# Patient Record
Sex: Male | Born: 1975 | Race: White | Hispanic: No | Marital: Married | Smoking: Current every day smoker
Health system: Southern US, Community
[De-identification: ages and names within clinical notes are randomized; demographics above are authoritative.]

## PROBLEM LIST (undated history)

## (undated) DIAGNOSIS — R9082 White matter disease, unspecified: Secondary | ICD-10-CM

## (undated) DIAGNOSIS — G629 Polyneuropathy, unspecified: Secondary | ICD-10-CM

## (undated) DIAGNOSIS — M1990 Unspecified osteoarthritis, unspecified site: Secondary | ICD-10-CM

## (undated) DIAGNOSIS — R413 Other amnesia: Secondary | ICD-10-CM

## (undated) DIAGNOSIS — J302 Other seasonal allergic rhinitis: Secondary | ICD-10-CM

## (undated) DIAGNOSIS — T7840XA Allergy, unspecified, initial encounter: Secondary | ICD-10-CM

## (undated) DIAGNOSIS — Z8719 Personal history of other diseases of the digestive system: Secondary | ICD-10-CM

## (undated) DIAGNOSIS — G43909 Migraine, unspecified, not intractable, without status migrainosus: Secondary | ICD-10-CM

## (undated) DIAGNOSIS — K5790 Diverticulosis of intestine, part unspecified, without perforation or abscess without bleeding: Secondary | ICD-10-CM

## (undated) DIAGNOSIS — K635 Polyp of colon: Secondary | ICD-10-CM

## (undated) DIAGNOSIS — G4733 Obstructive sleep apnea (adult) (pediatric): Secondary | ICD-10-CM

## (undated) DIAGNOSIS — E785 Hyperlipidemia, unspecified: Secondary | ICD-10-CM

## (undated) DIAGNOSIS — S6291XA Unspecified fracture of right wrist and hand, initial encounter for closed fracture: Secondary | ICD-10-CM

## (undated) DIAGNOSIS — H53453 Other localized visual field defect, bilateral: Secondary | ICD-10-CM

## (undated) DIAGNOSIS — I10 Essential (primary) hypertension: Secondary | ICD-10-CM

## (undated) DIAGNOSIS — K4090 Unilateral inguinal hernia, without obstruction or gangrene, not specified as recurrent: Secondary | ICD-10-CM

## (undated) DIAGNOSIS — K219 Gastro-esophageal reflux disease without esophagitis: Secondary | ICD-10-CM

## (undated) DIAGNOSIS — G4730 Sleep apnea, unspecified: Secondary | ICD-10-CM

## (undated) DIAGNOSIS — S82201A Unspecified fracture of shaft of right tibia, initial encounter for closed fracture: Secondary | ICD-10-CM

## (undated) DIAGNOSIS — G459 Transient cerebral ischemic attack, unspecified: Secondary | ICD-10-CM

## (undated) DIAGNOSIS — Z862 Personal history of diseases of the blood and blood-forming organs and certain disorders involving the immune mechanism: Secondary | ICD-10-CM

## (undated) DIAGNOSIS — G5603 Carpal tunnel syndrome, bilateral upper limbs: Secondary | ICD-10-CM

## (undated) DIAGNOSIS — R748 Abnormal levels of other serum enzymes: Secondary | ICD-10-CM

## (undated) DIAGNOSIS — Z87442 Personal history of urinary calculi: Secondary | ICD-10-CM

## (undated) HISTORY — DX: Obstructive sleep apnea (adult) (pediatric): G47.33

## (undated) HISTORY — DX: Unilateral inguinal hernia, without obstruction or gangrene, not specified as recurrent: K40.90

## (undated) HISTORY — PX: TIBIA FRACTURE SURGERY: SHX806

## (undated) HISTORY — DX: Sleep apnea, unspecified: G47.30

## (undated) HISTORY — DX: Essential (primary) hypertension: I10

## (undated) HISTORY — DX: Polyp of colon: K63.5

## (undated) HISTORY — PX: WISDOM TOOTH EXTRACTION: SHX21

## (undated) HISTORY — DX: Hyperlipidemia, unspecified: E78.5

## (undated) HISTORY — DX: Gastro-esophageal reflux disease without esophagitis: K21.9

## (undated) HISTORY — DX: Allergy, unspecified, initial encounter: T78.40XA

## (undated) HISTORY — DX: Unspecified osteoarthritis, unspecified site: M19.90

---
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
## 2018-04-14 ENCOUNTER — Encounter (INDEPENDENT_AMBULATORY_CARE_PROVIDER_SITE_OTHER): Payer: Self-pay

## 2018-04-14 ENCOUNTER — Encounter: Payer: Self-pay | Admitting: Gastroenterology

## 2018-04-14 ENCOUNTER — Other Ambulatory Visit (INDEPENDENT_AMBULATORY_CARE_PROVIDER_SITE_OTHER): Payer: BLUE CROSS/BLUE SHIELD

## 2018-04-14 ENCOUNTER — Ambulatory Visit (INDEPENDENT_AMBULATORY_CARE_PROVIDER_SITE_OTHER): Payer: BLUE CROSS/BLUE SHIELD | Admitting: Gastroenterology

## 2018-04-14 VITALS — BP 120/72 | HR 68 | Ht 75.0 in | Wt 215.0 lb

## 2018-04-14 DIAGNOSIS — R1310 Dysphagia, unspecified: Secondary | ICD-10-CM | POA: Diagnosis not present

## 2018-04-14 DIAGNOSIS — R12 Heartburn: Secondary | ICD-10-CM | POA: Diagnosis not present

## 2018-04-14 DIAGNOSIS — K219 Gastro-esophageal reflux disease without esophagitis: Secondary | ICD-10-CM

## 2018-04-14 LAB — CBC
HCT: 44 % (ref 39.0–52.0)
Hemoglobin: 15.1 g/dL (ref 13.0–17.0)
MCHC: 34.4 g/dL (ref 30.0–36.0)
MCV: 90 fl (ref 78.0–100.0)
Platelets: 346 10*3/uL (ref 150.0–400.0)
RBC: 4.89 Mil/uL (ref 4.22–5.81)
RDW: 13.3 % (ref 11.5–15.5)
WBC: 11.1 10*3/uL — ABNORMAL HIGH (ref 4.0–10.5)

## 2018-04-14 LAB — SEDIMENTATION RATE: Sed Rate: 15 mm/hr (ref 0–15)

## 2018-04-14 LAB — BASIC METABOLIC PANEL
BUN: 26 mg/dL — ABNORMAL HIGH (ref 6–23)
CO2: 24 mEq/L (ref 19–32)
Calcium: 10.3 mg/dL (ref 8.4–10.5)
Chloride: 102 mEq/L (ref 96–112)
Creatinine, Ser: 0.95 mg/dL (ref 0.40–1.50)
GFR: 87.09 mL/min (ref >60.00)
Glucose, Bld: 89 mg/dL (ref 70–99)
POTASSIUM: 4 meq/L (ref 3.5–5.1)
Sodium: 138 mEq/L (ref 135–145)

## 2018-04-14 MED ORDER — OMEPRAZOLE 40 MG PO CPDR: 40.0000 mg | DELAYED_RELEASE_CAPSULE | Freq: Two times a day (BID) | ORAL | 2 refills | Status: DC

## 2018-04-14 NOTE — Progress Notes (Signed)
[facility] VISIT   Primary Care Provider Pomposini, Cherly Anderson, MD No address on file [phone]  Referring Provider PCP as above  Patient Profile: Colton Walker is a 43 y.o. male with a pmh significant for hx of hypertension, hyperlipidemia, sleep apnea (on CPAP), seasonal allergies, osteoarthritis, prior TIA (on aspirin), longstanding GERD.  The patient presents to the [facility] for an evaluation and management of problem(s) noted below:  Problem List 1. Gastroesophageal reflux disease, esophagitis presence not specified   2. Dysphagia, unspecified type   3. Pyrosis     History of Present Illness: This is the patient's first visit to the outpatient [place] clinic.  The patient states that since he was a teenager who dealt with issues of acid reflux.  Starting around the age of 41 or 5 he began to see a gastroenterologist and began taking PPI therapy.  He is never been on high-dose PPI therapy twice daily but continue taking a PPI until his early 50s.  He describes his symptoms as being heartburn and a burning sensation in his mid chest.  Around the age of 65-14 he began undergoing periodic endoscopies and recalls having multiple endoscopies every other month to "stretch his esophagus".  He recalls these being helpful but has not had any repeat endoscopy since his teenage years.  Approximately 10 years ago he stopped taking his medications because there was no difference in his symptoms.  Approximately 2 months ago he began to experience issues of worsening heartburn last solid food dysphagia.  Within the last month he was diagnosed with strep throat and treated for that.  Pills have been a problem however he does have to regurgitate his food on a frequent basis, at least every other day if not every day.  Liquids passed without issue.  His weights have been stable.  Because of these issues he began to restart omeprazole and has been  taking it at 20 mg twice a day (but only at one point during the day for a total of 40 mg daily).  The patient does not take any other nonsteroidals or BC 300 he never had a colonoscopy but has had an endoscopy as described previously.  GI Review of Systems Positive as above Negative for odynophagia, globus, epigastric pain, nausea, hematemesis, jaundice, change in bowel habits, melena, hematochezia  Review of Systems General: Denies fevers/chills HEENT: Denies oral lesions Cardiovascular: Denies chest pain/palpitations Pulmonary: Denies shortness of breath/nocturnal cough Gastroenterological: See HPI Genitourinary: Denies darkened urine Hematological: Denies easy bruising/bleeding Endocrine: Denies temperature intolerance Dermatological: Denies jaundice Psychological: Mood is stable but hopeful that we can make a difference for him   Medications Current Outpatient Medications  Medication Sig Dispense Refill  . aspirin EC 81 MG tablet Take 81 mg by mouth daily.    . Cyanocobalamin (B-12) 1000 MCG TABS Take 1 tablet by mouth daily.    Marland Kitchen lisinopril (PRINIVIL,ZESTRIL) 10 MG tablet Take 10 mg by mouth daily.    . multivitamin (ONE-A-DAY MEN'S) TABS tablet Take 1 tablet by mouth daily.    . propranolol (INDERAL) 20 MG tablet Take 20 mg by mouth 3 (three) times daily.    . rosuvastatin (CRESTOR) 10 MG tablet Take 10 mg by mouth daily.    Marland Kitchen omeprazole (PRILOSEC) 40 MG capsule Take 1 capsule (40 mg total) by mouth 2 (two) times daily. 60 capsule 2   No current facility-administered medications for this visit.     Allergies Allergies  Allergen Reactions  .  Lortab [Hydrocodone-Acetaminophen]   . Morphine Nausea Only  . Penicillins     Histories Past Medical History:  Diagnosis Date  . Allergy   . Arthritis   . GERD (gastroesophageal reflux disease)   . HTN (hypertension)   . Hyperlipidemia   . OSA (obstructive sleep apnea)    uses cpap  . Sleep apnea    Past Surgical  History:  Procedure Laterality Date  . TIBIA FRACTURE SURGERY Right    Social History   Socioeconomic History  . Marital status: Married    Spouse name: Not on file  . Number of children: Not on file  . Years of education: Not on file  . Highest education level: Not on file  Occupational History  . Not on file  Social Needs  . Financial resource strain: Not on file  . Food insecurity:    Worry: Not on file    Inability: Not on file  . Transportation needs:    Medical: Not on file    Non-medical: Not on file  Tobacco Use  . Smoking status: Current Every Day Smoker    Packs/day: 0.50    Types: Cigarettes  . Smokeless tobacco: Never Used  Substance and Sexual Activity  . Alcohol use: Never    Frequency: Never  . Drug use: Never  . Sexual activity: Not on file  Lifestyle  . Physical activity:    Days per week: Not on file    Minutes per session: Not on file  . Stress: Not on file  Relationships  . Social connections:    Talks on phone: Not on file    Gets together: Not on file    Attends religious service: Not on file    Active member of club or organization: Not on file    Attends meetings of clubs or organizations: Not on file    Relationship status: Not on file  . Intimate partner violence:    Fear of current or ex partner: Not on file    Emotionally abused: Not on file    Physically abused: Not on file    Forced sexual activity: Not on file  Other Topics Concern  . Not on file  Social History Narrative  . Not on file   Family History  Problem Relation Age of Onset  . Hypertension Mother   . Heart disease Father   . Hypertension Father   . Colon cancer Neg Hx   . Esophageal cancer Neg Hx   . Stomach cancer Neg Hx   . Rectal cancer Neg Hx   . Inflammatory bowel disease Neg Hx   . Liver disease Neg Hx   . Pancreatic cancer Neg Hx    I have reviewed his medical, social, and family history in detail and updated the electronic medical record as necessary.     PHYSICAL EXAMINATION  BP 120/72   Pulse 68   Ht 6\' 3"  (1.905 m)   Wt 215 lb (97.5 kg)   BMI 26.87 kg/m  Wt Readings from Last 3 Encounters:  04/16/18 215 lb (97.5 kg)  04/14/18 215 lb (97.5 kg)  GEN: NAD, appears stated age, doesn't appear chronically ill PSYCH: Cooperative, without pressured speech EYE: Conjunctivae pink, sclerae anicteric ENT: MMM, without oral ulcers, no erythema or exudates noted NECK: Supple CV: RR without R/Gs  RESP: CTAB posteriorly, without wheezing GI: NABS, soft, NT/ND, without rebound or guarding, no HSM appreciated MSK/EXT: No lower extremity edema SKIN: No jaundice NEURO:  Alert &  Oriented x 3, no focal deficits   REVIEW OF DATA  I reviewed the following data at the time of this encounter:  GI Procedures and Studies  No relevant studies to review His previous pediatric gastroenterologist has retired and he cannot obtain records  Laboratory Studies  Reviewed outside records  Imaging Studies  No relevant studies to review   ASSESSMENT  Mr. Russey is a 43 y.o. male with a pmh significant for hx of hypertension, hyperlipidemia, sleep apnea (on CPAP), seasonal allergies, osteoarthritis, prior TIA (on aspirin), longstanding GERD.  The patient is seen today for evaluation and management of:  1. Gastroesophageal reflux disease, esophagitis presence not specified   2. Dysphagia, unspecified type   3. Pyrosis    The patient is hemodynamically stable presents for evaluation as above.  Longstanding issues of GERD-like symptoms with now progressive issues of dysphagia with solid food regurgitation.  Does not sound to be rumination syndrome.  As young as the patient is and as young as he did have symptoms I do think we need to rule out eosinophilic esophagitis and lymphocytic esophagitis as potential causes of his persistent symptoms.  We will maximize his PPI therapy and over the course of the coming weeks until we can get him in for an endoscopy plan  on keeping him on that.  If his work-up above is unremarkable for source of dysphagia and I think it would be reasonable to consider a manometry and potential pH impedance testing.  I will see how he does with high-dose PPI therapy and what his endoscopy shows.  We will plan possible dilation if we find a stricture or ring.  The risks and benefits of endoscopic evaluation were discussed with the patient; these include but are not limited to the risk of perforation, infection, bleeding, missed lesions, lack of diagnosis, severe illness requiring hospitalization, as well as anesthesia and sedation related illnesses.  The patient is agreeable to proceed.  All patient questions were answered, to the best of my ability, and the patient agrees to the aforementioned plan of action with follow-up as indicated.   PLAN  Laboratories as outlined below Omeprazole 40 mg twice daily to be prescribed Proceed with diagnostic endoscopy (esophageal and gastric biopsies) Follow-up thereafter to consider role of manometry/pH impedance   Orders Placed This Encounter  Procedures  . CBC  . Basic Metabolic Panel (BMET)  . Sedimentation rate  . Ambulatory referral to Gastroenterology    New Prescriptions   OMEPRAZOLE (PRILOSEC) 40 MG CAPSULE    Take 1 capsule (40 mg total) by mouth 2 (two) times daily.   Modified Medications   No medications on file    Planned Follow Up: No follow-ups on file.   Justice Britain, MD [facility] Office # [phone]

## 2018-04-14 NOTE — Patient Instructions (Addendum)
If you are age 42 or older, your body mass index should be between 23-30. Your Body mass index is 26.87 kg/m. If this is out of the aforementioned range listed, please consider follow up with your Primary Care Provider.  If you are age 46 or younger, your body mass index should be between 19-25. Your Body mass index is 26.87 kg/m. If this is out of the aformentioned range listed, please consider follow up with your Primary Care Provider.   Your provider has requested that you go to the basement level for lab work before leaving today. Press "B" on the elevator. The lab is located at the first door on the left as you exit the elevator.   We have sent the following medications to your pharmacy for you to pick up at your convenience: Omeprazole   You have been scheduled for an endoscopy. Please follow written instructions given to you at your visit today. If you use inhalers (even only as needed), please bring them with you on the day of your procedure. Your physician has requested that you go to www.startemmi.com and enter the access code given to you at your visit today. This web site gives a general overview about your procedure. However, you should still follow specific instructions given to you by our office regarding your preparation for the procedure.  Thank you for choosing me and [facility].  Dr. Rush Landmark

## 2018-04-16 ENCOUNTER — Ambulatory Visit (AMBULATORY_SURGERY_CENTER): Payer: BLUE CROSS/BLUE SHIELD | Admitting: Gastroenterology

## 2018-04-16 ENCOUNTER — Encounter: Payer: Self-pay | Admitting: Gastroenterology

## 2018-04-16 ENCOUNTER — Other Ambulatory Visit: Payer: Self-pay

## 2018-04-16 VITALS — BP 122/71 | HR 60 | Temp 98.4°F | Resp 11 | Ht 75.0 in | Wt 215.0 lb

## 2018-04-16 DIAGNOSIS — R1310 Dysphagia, unspecified: Secondary | ICD-10-CM

## 2018-04-16 DIAGNOSIS — K219 Gastro-esophageal reflux disease without esophagitis: Secondary | ICD-10-CM | POA: Diagnosis not present

## 2018-04-16 MED ORDER — SODIUM CHLORIDE 0.9 % IV SOLN: 500.0000 mL | Freq: Once | INTRAVENOUS | Status: DC

## 2018-04-16 NOTE — Progress Notes (Signed)
Called to room to assist during endoscopic procedure.  Patient ID and intended procedure confirmed with present staff. Received instructions for my participation in the procedure from the performing physician.  

## 2018-04-16 NOTE — Progress Notes (Signed)
PT taken to PACU. Monitors in place. VSS. Report given to RN. 

## 2018-04-16 NOTE — Op Note (Signed)
[facility] Patient Name: Colton Walker Procedure Date: 04/16/2018 12:32 PM MRN: [id number] Endoscopist: Justice Britain , MD Age: 43 Referring MD:  Date of Birth: 01/26/1976 Gender: Male Account #: [id number] Procedure:                Upper GI endoscopy Indications:              Dysphagia, Heartburn, Gastro-esophageal reflux                            disease, Follow-up of gastro-esophageal reflux                            disease Medicines:                Monitored Anesthesia Care Procedure:                Pre-Anesthesia Assessment:                           - Prior to the procedure, a History and Physical                            was performed, and patient medications and                            allergies were reviewed. The patient's tolerance of                            previous anesthesia was also reviewed. The risks                            and benefits of the procedure and the sedation                            options and risks were discussed with the patient.                            All questions were answered, and informed consent                            was obtained. Prior Anticoagulants: The patient has                            taken no previous anticoagulant or antiplatelet                            agents. ASA Grade Assessment: II - A patient with                            mild systemic disease. After reviewing the risks                            and benefits, the patient was deemed in  satisfactory condition to undergo the procedure.                           After obtaining informed consent, the endoscope was                            passed under direct vision. Throughout the                            procedure, the patient's blood pressure, pulse, and                            oxygen saturations were monitored continuously. The                            Endoscope was introduced through the mouth, and                             advanced to the second part of duodenum. The upper                            GI endoscopy was accomplished without difficulty.                            The patient tolerated the procedure. Scope In: Scope Out: Findings:                 Mucosal changes including crepe paper esophagus                            were found in the entire esophagus. Esophageal                            findings were graded using the Eosinophilic                            Esophagitis Endoscopic Reference Score (EoE-EREFS)                            as: Edema Grade 0 Normal (distinct vascular                            markings), Rings Grade 0 None (no ridges or rings                            seen), Exudates Grade 0 None (no white lesions                            seen), Furrows Grade 1 Present (vertical lines with                            or without visible depth) and Stricture none (no  stricture found). Biopsies were taken with a cold                            forceps for histology from the proximal/middle                            esophagus. Biopsies were taken with a cold forceps                            for histology from the distal esophagus.                           One tongue of salmon-colored mucosa was present                            from 42 to 44 cm. No other visible abnormalities                            were present. The maximum longitudinal extent of                            these esophageal mucosal changes was 2 cm in                            length. Mucosa was biopsied with a cold forceps for                            histology in a targeted manner. One specimen bottle                            was sent to pathology.                           Striped moderately erythematous mucosa without                            bleeding was found in the gastric antrum.                           No other gross lesions were noted in  the entire                            examined stomach. Biopsies were taken with a cold                            forceps for histology and Helicobacter pylori                            testing from the antrum/incisura/greater                            curve/lesser curve/cardia.  No gross lesions were noted in the duodenal bulb,                            in the first portion of the duodenum and in the                            second portion of the duodenum. Complications:            No immediate complications. Estimated Blood Loss:     Estimated blood loss was minimal. Impression:               - Esophageal mucosal changes suspicious for                            eosinophilic esophagitis. Biopsied. No evidence of                            a ring/stricture.                           - Salmon-colored mucosa suggestive of short-segment                            Barrett's esophagus. Biopsied.                           - Erythematous mucosa in the antrum. No other gross                            lesions in the stomach. Biopsied for HP.                           - No gross lesions in the duodenal bulb, in the                            first portion of the duodenum and in the second                            portion of the duodenum. Recommendation:           - The patient will be observed post-procedure,                            until all discharge criteria are met.                           - Discharge patient to home.                           - Patient has a contact number available for                            emergencies. The signs and symptoms of potential                            delayed  complications were discussed with the                            patient. Return to normal activities tomorrow.                            Written discharge instructions were provided to the                            patient.                           - Resume  previous diet.                           - Continue present medications.                           - Continue PPI BID at current dosing for next few                            weeks.                           - If no evidence of EoE and symptoms persist on                            high dose PPI, we will need to consider the next                            steps in evaluation for the patient in regards to                            his dysphagia as a manometry. We can consider an                            empiric dilation if manometry is unremarkable if                            sympto12m persist.                           - The findings and recommendations were discussed                            with the patient.                           - The findings and recommendations were discussed                            with the patient's family. GJustice Britain MD 04/16/2018 12:57:33 PM

## 2018-04-16 NOTE — Patient Instructions (Signed)
Continue present medications. Continue omeprazole at current dosing for next few weeks.       YOU HAD AN ENDOSCOPIC PROCEDURE TODAY AT [facility]:   Refer to the procedure report that was given to you for any specific questions about what was found during the examination.  If the procedure report does not answer your questions, please call your gastroenterologist to clarify.  If you requested that your care partner not be given the details of your procedure findings, then the procedure report has been included in a sealed envelope for you to review at your convenience later.  YOU SHOULD EXPECT: Some feelings of bloating in the abdomen. Passage of more gas than usual.  Walking can help get rid of the air that was put into your GI tract during the procedure and reduce the bloating. If you had a lower endoscopy (such as a colonoscopy or flexible sigmoidoscopy) you may notice spotting of blood in your stool or on the toilet paper. If you underwent a bowel prep for your procedure, you may not have a normal bowel movement for a few days.  Please Note:  You might notice some irritation and congestion in your nose or some drainage.  This is from the oxygen used during your procedure.  There is no need for concern and it should clear up in a day or so.  SYMPTOMS TO REPORT IMMEDIATELY:     Following upper endoscopy (EGD)  Vomiting of blood or coffee ground material  New chest pain or pain under the shoulder blades  Painful or persistently difficult swallowing  New shortness of breath  Fever of 100F or higher  Black, tarry-looking stools  For urgent or emergent issues, a gastroenterologist can be reached at any hour by calling [phone].   DIET:  We do recommend a small meal at first, but then you may proceed to your regular diet.  Drink plenty of fluids but you should avoid alcoholic beverages for 24 hours.  ACTIVITY:  You should plan to take it easy for the rest of  today and you should NOT DRIVE or use heavy machinery until tomorrow (because of the sedation medicines used during the test).    FOLLOW UP: Our staff will call the number listed on your records the next business day following your procedure to check on you and address any questions or concerns that you may have regarding the information given to you following your procedure. If we do not reach you, we will leave a message.  However, if you are feeling well and you are not experiencing any problems, there is no need to return our call.  We will assume that you have returned to your regular daily activities without incident.  If any biopsies were taken you will be contacted by phone or by letter within the next 1-3 weeks.  Please call us at [phone] if you have not heard about the biopsies in 3 weeks.    SIGNATURES/CONFIDENTIALITY: You and/or your care partner have signed paperwork which will be entered into your electronic medical record.  These signatures attest to the fact that that the information above on your After Visit Summary has been reviewed and is understood.  Full responsibility of the confidentiality of this discharge information lies with you and/or your care-partner.

## 2018-04-17 ENCOUNTER — Telehealth: Payer: Self-pay

## 2018-04-17 ENCOUNTER — Telehealth: Payer: Self-pay | Admitting: *Deleted

## 2018-04-17 ENCOUNTER — Encounter: Payer: Self-pay | Admitting: Gastroenterology

## 2018-04-17 DIAGNOSIS — R12 Heartburn: Secondary | ICD-10-CM | POA: Insufficient documentation

## 2018-04-17 DIAGNOSIS — K219 Gastro-esophageal reflux disease without esophagitis: Secondary | ICD-10-CM | POA: Insufficient documentation

## 2018-04-17 DIAGNOSIS — R1310 Dysphagia, unspecified: Secondary | ICD-10-CM | POA: Insufficient documentation

## 2018-04-17 NOTE — Telephone Encounter (Signed)
First follow up call attempt.  No voicemail option available.

## 2018-04-17 NOTE — Telephone Encounter (Signed)
Called and sw KeKe at [facility], Omeprazole 40mg  BID has been approved eff: 04/17/2018-04/17/2019. Auth# [id number] . 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

## 2018-04-17 NOTE — Telephone Encounter (Signed)
Pt reported that he is having abdominal pain. Please call back.

## 2018-04-17 NOTE — Telephone Encounter (Signed)
Returned call to patient. Voice mail has not been set up, unable to leave message. If patient calls back, please ask for Olivia Mackie at [phone].  Thank you.

## 2018-04-17 NOTE — Telephone Encounter (Signed)
  Follow up Call-  Call back number 04/16/2018  Post procedure Call Back phone  # [phone]  Permission to leave phone message Yes     Patient questions:  Do you have a fever, pain , or abdominal swelling? Yes.   Pain Score  3 *  Have you tolerated food without any problems? Yes.    Have you been able to return to your normal activities? Yes.    Do you have any questions about your discharge instructions: Diet   No. Medications  No. Follow up visit  No.  Do you have questions or concerns about your Care? No.  Actions: * If pain score is 4 or above: No action needed, pain <4.  Patient has some discomfort when drinking and eating.  No bleeding or vomiting.  No fever.

## 2018-04-22 ENCOUNTER — Telehealth: Payer: Self-pay | Admitting: Gastroenterology

## 2018-04-22 NOTE — Telephone Encounter (Signed)
Pt was informed of labs results.

## 2018-04-22 NOTE — Telephone Encounter (Signed)
Pt returned your call regarding results, pls call him again.  °

## 2018-04-24 ENCOUNTER — Encounter: Payer: Self-pay | Admitting: Gastroenterology

## 2018-04-24 NOTE — Progress Notes (Signed)
Review of outside records obtained February 2020 clinic visit Reason for appointment dysphonia Reported history of dysphagia ongoing for years.  He has been on reflux medications.  Reported to provider that he has been stretched multiple times and that he was told on an x-ray he had scarring we do not have access to that. Patient referred to GI  We will scanned in the records.

## 2018-04-26 ENCOUNTER — Encounter: Payer: Self-pay | Admitting: Gastroenterology

## 2018-04-27 ENCOUNTER — Telehealth: Payer: Self-pay

## 2018-04-27 NOTE — Telephone Encounter (Signed)
No voice mail will send via My Chart

## 2018-04-27 NOTE — Telephone Encounter (Signed)
-----   Message from Irving Copas., MD sent at 04/26/2018  2:43 PM EDT ----- Regarding: Follow-up Colton Walker,Please schedule a follow-up for approximately 3 to 5 weeks.  If we are still in the setting of doing telehealth visits then we will talk with him at that point versus a follow-up in person in 6 to 10 weeks.Thank you.GM

## 2018-04-27 NOTE — Telephone Encounter (Signed)
Virtual office visit scheduled for 05/26/18 at 9:45 am

## 2018-05-12 ENCOUNTER — Telehealth: Payer: Self-pay | Admitting: Gastroenterology

## 2018-05-12 NOTE — Telephone Encounter (Signed)
In light of the COVID-19 situation, we need to discuss things further in a few weeks.  We will begin doing telehealth visits in the coming weeks and if you are interested in wanting to do 1 in follow-up we can discuss next steps thereafter versus seeing you back in clinic once things, hopefully are improved in the setting of COVID-19. We may consider the role of an esophageal manometry plus minus pH impedance testing.

## 2018-05-12 NOTE — Telephone Encounter (Signed)
Pt had EGD done 3.12.20 and would like a call back to discuss a letter that he has received.

## 2018-05-13 NOTE — Telephone Encounter (Signed)
The pt wanted to confirm his telehealth appt on 4-21.  His appt was confirmed.  He will keep appt as planned.

## 2018-05-13 NOTE — Telephone Encounter (Signed)
I tried to call the pt and the phone states that a voice mail has not been set up.  I will try later to call the pt

## 2018-05-26 ENCOUNTER — Telehealth: Payer: Self-pay | Admitting: Gastroenterology

## 2018-05-26 ENCOUNTER — Telehealth (INDEPENDENT_AMBULATORY_CARE_PROVIDER_SITE_OTHER): Payer: BLUE CROSS/BLUE SHIELD | Admitting: Gastroenterology

## 2018-05-26 ENCOUNTER — Other Ambulatory Visit: Payer: Self-pay

## 2018-05-26 DIAGNOSIS — R1310 Dysphagia, unspecified: Secondary | ICD-10-CM | POA: Diagnosis not present

## 2018-05-26 DIAGNOSIS — R194 Change in bowel habit: Secondary | ICD-10-CM

## 2018-05-26 DIAGNOSIS — K219 Gastro-esophageal reflux disease without esophagitis: Secondary | ICD-10-CM

## 2018-05-26 MED ORDER — DEXLANSOPRAZOLE 60 MG PO CPDR: 60.0000 mg | DELAYED_RELEASE_CAPSULE | Freq: Every day | ORAL | 3 refills | Status: DC

## 2018-05-26 NOTE — Patient Instructions (Signed)
Decrease your omeprazole to once daily. We have also sent a new prescription for Dexilant to your pharmacy.   Start Fibercon daily.   If in 2 weeks you are still having loose bowel movements, then call our office to set up next steps.   We will contact you in 6 weeks to schedule your Manometry/pH impedence study off PPI at the hospital. You will have to come off your acid medication x 2 weeks prior to this study.   Dr. Rush Landmark wants you to follow up 2 weeks after the manometry.

## 2018-05-26 NOTE — Progress Notes (Signed)
[place] [facility] VISIT   Primary Care Provider Pomposini, Cherly Anderson, MD No address on file [phone]  Referring Provider PCP as above  Patient Profile: Colton Walker is a 43 y.o. male with a pmh significant for hx of hypertension, hyperlipidemia, sleep apnea (on CPAP), seasonal allergies, osteoarthritis, prior TIA (on aspirin), longstanding GERD.  The patient presents to the [facility] for an evaluation and management of problem(s) noted below:  Problem List 1. Dysphagia, unspecified type   2. Gastroesophageal reflux disease, esophagitis presence not specified   3. Change in bowel habits     History of Present Illness: Please see initial consultation note for full details of HPI.    I connected with  Peri Jefferson on 05/28/18. I verified that I was speaking with the correct person using two identifiers. Due to the COVID-19 Pandemic, this service was provided via telemedicine using audio/visual media. The patient was located at home. The provider was located in the office. The patient did consent to this visit and is aware of charges through their insurance as well as the limitations of evaluation and management by telemedicine. Other persons participating in this telemedicine service were none.  Interval History This was a scheduled follow-up.  The patient states he is still having symptoms of regurgitation/dysphagia.  Have not dramatically changed with being on high-dose PPI therapy.  He has not had acute food impaction.  His pyrosis remains present.  Patient states that for the last 3 to 3-1/2 weeks he has been having looser bowel movements.  Not clear what has caused this.  The only medication change has been the addition of the twice daily PPI therapy.  No blood in the stool.  No one else has been sick around him.  He is not using any fiber supplements.  No tenesmus.    GI Review of Systems Positive as above Negative for  odynophagia, globus, abdominal pain, nausea, melena, hematochezia   Review of Systems General: Denies fevers/chills/weight loss Cardiovascular: Denies chest pain Pulmonary: Denies shortness of breath Gastroenterological: See HPI Genitourinary: Denies darkened urine Hematological: Denies easy bruising Endocrine: Denies temperature intolerance Dermatological: Denies jaundice Psychological: Mood is anxious about his GI symptoms   Medications Current Outpatient Medications  Medication Sig Dispense Refill  . aspirin EC 81 MG tablet Take 81 mg by mouth daily.    . Cyanocobalamin (B-12) 1000 MCG TABS Take 1 tablet by mouth daily.    Marland Kitchen dexlansoprazole (DEXILANT) 60 MG capsule Take 1 capsule (60 mg total) by mouth daily. 90 capsule 3  . lisinopril (PRINIVIL,ZESTRIL) 10 MG tablet Take 10 mg by mouth daily.    . multivitamin (ONE-A-DAY MEN'S) TABS tablet Take 1 tablet by mouth daily.    Marland Kitchen omeprazole (PRILOSEC) 40 MG capsule Take 1 capsule (40 mg total) by mouth 2 (two) times daily. 60 capsule 2  . propranolol (INDERAL) 20 MG tablet Take 20 mg by mouth 3 (three) times daily.    . rosuvastatin (CRESTOR) 10 MG tablet Take 10 mg by mouth daily.     No current facility-administered medications for this visit.     Allergies Allergies  Allergen Reactions  . Lortab [Hydrocodone-Acetaminophen]   . Morphine Nausea Only  . Penicillins     Histories Past Medical History:  Diagnosis Date  . Allergy   . Arthritis   . GERD (gastroesophageal reflux disease)   . HTN (hypertension)   . Hyperlipidemia   . OSA (obstructive sleep apnea)    uses cpap  .  Sleep apnea    Past Surgical History:  Procedure Laterality Date  . TIBIA FRACTURE SURGERY Right    Social History   Socioeconomic History  . Marital status: Married    Spouse name: Not on file  . Number of children: Not on file  . Years of education: Not on file  . Highest education level: Not on file  Occupational History  . Not on file   Social Needs  . Financial resource strain: Not on file  . Food insecurity:    Worry: Not on file    Inability: Not on file  . Transportation needs:    Medical: Not on file    Non-medical: Not on file  Tobacco Use  . Smoking status: Current Every Day Smoker    Packs/day: 0.50    Types: Cigarettes  . Smokeless tobacco: Never Used  Substance and Sexual Activity  . Alcohol use: Never    Frequency: Never  . Drug use: Never  . Sexual activity: Not on file  Lifestyle  . Physical activity:    Days per week: Not on file    Minutes per session: Not on file  . Stress: Not on file  Relationships  . Social connections:    Talks on phone: Not on file    Gets together: Not on file    Attends religious service: Not on file    Active member of club or organization: Not on file    Attends meetings of clubs or organizations: Not on file    Relationship status: Not on file  . Intimate partner violence:    Fear of current or ex partner: Not on file    Emotionally abused: Not on file    Physically abused: Not on file    Forced sexual activity: Not on file  Other Topics Concern  . Not on file  Social History Narrative  . Not on file   Family History  Problem Relation Age of Onset  . Hypertension Mother   . Heart disease Father   . Hypertension Father   . Colon cancer Neg Hx   . Esophageal cancer Neg Hx   . Stomach cancer Neg Hx   . Rectal cancer Neg Hx   . Inflammatory bowel disease Neg Hx   . Liver disease Neg Hx   . Pancreatic cancer Neg Hx    I have reviewed his medical, social, and family history in detail and updated the electronic medical record as necessary.    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 Biopsied. No evidence of a ring/stricture. - Salmon-colored mucosa  suggestive of short-segment Barrett's esophagus. Biopsied. - Erythematous mucosa in the antrum. No other gross lesions in the stomach. Biopsied for HP. - No gross lesions in the duodenal bulb, in the first portion of the duodenum and in the second portion of the duodenum.  Laboratory Studies  Reviewed outside records  Imaging Studies  No relevant studies to review   ASSESSMENT  Mr. Rondon is a 43 y.o. male with a pmh significant for hx of hypertension, hyperlipidemia, sleep apnea (on CPAP), seasonal allergies, osteoarthritis, prior TIA (on aspirin), longstanding GERD.  The patient is seen today for evaluation and management of:  1. Dysphagia, unspecified type   2. Gastroesophageal reflux disease, esophagitis presence not specified   3.  Change in bowel habits    The patient seems to be hemodynamically stable.  From a clinical perspective we have not made great effect with being on twice daily PPI therapy.  I think he will benefit from esophageal manometry testing as well as pH impedance testing.  I think this patient may benefit from surgical evaluation for fundoplication versus other therapies.  He did not have a large hiatal hernia we could consider the role of a possible TIF.  I am not sure what has caused his changes in bowel habits.  If it is his PPI therapy we will decrease that to once daily.  I would also like to initiate a change of PPI therapy to Dexilant.  This would be a once daily medication and would have a 20 to 24-hour bioavailability.  I discussed with him consideration of an empiric dilation I think I would do that before we send him down the road of a surgical evaluation to see how symptoms felt but I think many signs are pointing towards that evaluation is being something to consider in the future.  We will get some stool studies to further evaluate issues if after initiation of FiberCon once daily he still has problems.  Could consider a probiotic in the future as well.  All  patient questions were answered, to the best of my ability, and the patient agrees to the aforementioned plan of action with follow-up as indicated.   PLAN  Omeprazole 40 mg BID -> QD New Rx for Dexilant once daily Manometry/pH impedence off PPI in 8-weeks after COVID 19 pandemic is better understood Start Fibercon once daily Monitor diarrhea, if still loose after 2 weeks then call office will need stool studies to include stool culture/ova and parasite/C. difficile/GI pathogen panel and possibly a fecal elastase and spot fecal fat and a TSH level   No orders of the defined types were placed in this encounter.   New Prescriptions   DEXLANSOPRAZOLE (DEXILANT) 60 MG CAPSULE    Take 1 capsule (60 mg total) by mouth daily.   Modified Medications   No medications on file    Planned Follow Up: No follow-ups on file.   Justice Britain, MD [facility] Office # [phone]

## 2018-05-26 NOTE — Telephone Encounter (Signed)
Patient called said that the med [place] is to expensive for him. If their is something else he can take.

## 2018-05-27 NOTE — Telephone Encounter (Signed)
Attempted to contact patient with no answer and no voicemail to leave a message.

## 2018-05-27 NOTE — Telephone Encounter (Signed)
OK to do Nexium 40 mg BID. Thanks. GM

## 2018-05-28 DIAGNOSIS — R194 Change in bowel habit: Secondary | ICD-10-CM | POA: Insufficient documentation

## 2018-06-05 MED ORDER — ESOMEPRAZOLE MAGNESIUM 40 MG PO CPDR: 40.0000 mg | DELAYED_RELEASE_CAPSULE | Freq: Two times a day (BID) | ORAL | 11 refills | Status: DC

## 2018-06-05 NOTE — Telephone Encounter (Signed)
Prescription sent to patient's pharmacy.

## 2018-07-06 ENCOUNTER — Telehealth: Payer: Self-pay

## 2018-07-06 ENCOUNTER — Other Ambulatory Visit: Payer: Self-pay

## 2018-07-06 DIAGNOSIS — K219 Gastro-esophageal reflux disease without esophagitis: Secondary | ICD-10-CM

## 2018-07-06 DIAGNOSIS — R194 Change in bowel habit: Secondary | ICD-10-CM

## 2018-07-06 DIAGNOSIS — R1310 Dysphagia, unspecified: Secondary | ICD-10-CM

## 2018-07-06 NOTE — Telephone Encounter (Signed)
Pt scheduled for 07/20/2018 @ [facility] 12:30 for manometry/ph impedence. Pt advised to stop PPI and resume after study. Pt states that he understands.

## 2018-07-09 ENCOUNTER — Other Ambulatory Visit: Payer: Self-pay

## 2018-07-16 ENCOUNTER — Other Ambulatory Visit (HOSPITAL_COMMUNITY)
Admission: RE | Admit: 2018-07-16 | Discharge: 2018-07-16 | Disposition: A | Discharge status: Home / Self Care | Payer: BC Managed Care – PPO | Source: Ambulatory Visit | Attending: Gastroenterology | Admitting: Gastroenterology

## 2018-07-16 ENCOUNTER — Telehealth: Payer: Self-pay

## 2018-07-16 ENCOUNTER — Other Ambulatory Visit: Payer: Self-pay

## 2018-07-16 DIAGNOSIS — Z1159 Encounter for screening for other viral diseases: Secondary | ICD-10-CM | POA: Diagnosis present

## 2018-07-16 NOTE — Telephone Encounter (Signed)
Patient stopped by to get a work note because he needs to quarantine since having his COVID testing done today. His procedure is 07/20/2018.

## 2018-07-17 LAB — NOVEL CORONAVIRUS, NAA (HOSP ORDER, SEND-OUT TO REF LAB; TAT 18-24 HRS): SARS-CoV-2, NAA: NOT DETECTED

## 2018-07-20 ENCOUNTER — Encounter (HOSPITAL_COMMUNITY)
Admission: RE | Disposition: A | Discharge status: Home / Self Care | Payer: Self-pay | Source: Home / Self Care | Attending: Gastroenterology

## 2018-07-20 ENCOUNTER — Ambulatory Visit (HOSPITAL_COMMUNITY)
Admission: RE | Admit: 2018-07-20 | Discharge: 2018-07-20 | Disposition: A | Discharge status: Home / Self Care | Payer: BC Managed Care – PPO | Attending: Gastroenterology | Admitting: Gastroenterology

## 2018-07-20 ENCOUNTER — Encounter (HOSPITAL_COMMUNITY): Payer: Self-pay | Admitting: Gastroenterology

## 2018-07-20 DIAGNOSIS — R12 Heartburn: Secondary | ICD-10-CM | POA: Insufficient documentation

## 2018-07-20 DIAGNOSIS — R1310 Dysphagia, unspecified: Secondary | ICD-10-CM | POA: Diagnosis present

## 2018-07-20 DIAGNOSIS — K219 Gastro-esophageal reflux disease without esophagitis: Secondary | ICD-10-CM | POA: Diagnosis not present

## 2018-07-20 HISTORY — PX: PH IMPEDANCE STUDY: SHX5565

## 2018-07-20 HISTORY — PX: ESOPHAGEAL MANOMETRY: SHX5429

## 2018-07-20 SURGERY — MANOMETRY, ESOPHAGUS

## 2018-07-20 MED ORDER — LIDOCAINE VISCOUS HCL 2 % MT SOLN
OROMUCOSAL | Status: AC
  Filled 2018-07-20: qty 15

## 2018-07-20 SURGICAL SUPPLY — 2 items
FACESHIELD LNG OPTICON STERILE (SAFETY) IMPLANT
GLOVE BIO SURGEON STRL SZ8 (GLOVE) ×8 IMPLANT

## 2018-07-20 NOTE — Progress Notes (Addendum)
  Esophageal manometry done per protocol.  Pt tolerated well. PH probe then placed at 48 cm at left nare. Pt tolerated well. Reports to be sent to Dr. Harl Bowie.  Patient to return 07/21/2018 at 1321 pm to have pH probe removed. Teaching done and patient verbalized understanding.

## 2018-07-20 NOTE — Discharge Instructions (Signed)
Excuse from Work, School, or Physical Activity °_______________________________________________________ needs to be excused from: °____ Work. °____ School. °____ Physical activity. °This is effective for the following dates: ______________________________________. °He or she may return to work or school, but should avoid physical activity or other activities from now until _______________. °Activity restrictions include: °____ Lifting more than _________ lb. °____ Sitting longer than __________ minutes at a time. °____ Standing longer than ________ minutes at a time. °____ Other activities including: ___________________________________________________________________________________ °____ He or she may return to full physical activity on ________________. °Health care provider name (printed): _____________________________________________________ °Health care provider (signature): _________________________________________________________ °Date: ______________________________ °This information is not intended to replace advice given to you by your health care provider. Make sure you discuss any questions you have with your health care provider. °Document Released: 07/17/2000 Document Revised: 01/16/2017 Document Reviewed: 01/16/2017 °Elsevier Interactive Patient Education © 2019 [employer] ° °

## 2018-07-21 ENCOUNTER — Encounter (HOSPITAL_COMMUNITY): Payer: Self-pay | Admitting: Gastroenterology

## 2018-07-22 ENCOUNTER — Other Ambulatory Visit: Payer: Self-pay | Admitting: Gastroenterology

## 2018-07-22 ENCOUNTER — Telehealth: Payer: Self-pay | Admitting: Gastroenterology

## 2018-07-22 NOTE — Telephone Encounter (Signed)
Patient called would like to know if he can start taking his medication. He has a ESOPHAGEAL MANOMETRY on 6-15

## 2018-07-23 NOTE — Telephone Encounter (Signed)
Yes, pt can resume his PPI. I have sent pt msg via my chart as pt's v/m has not been set-up and I was unable to leave message.

## 2018-07-28 ENCOUNTER — Ambulatory Visit (INDEPENDENT_AMBULATORY_CARE_PROVIDER_SITE_OTHER): Payer: BC Managed Care – PPO | Admitting: Gastroenterology

## 2018-07-28 ENCOUNTER — Telehealth: Payer: Self-pay | Admitting: Gastroenterology

## 2018-07-28 ENCOUNTER — Other Ambulatory Visit (HOSPITAL_COMMUNITY): Payer: Self-pay

## 2018-07-28 ENCOUNTER — Telehealth: Payer: Self-pay

## 2018-07-28 ENCOUNTER — Encounter: Payer: Self-pay | Admitting: Gastroenterology

## 2018-07-28 VITALS — Ht 75.0 in | Wt 215.0 lb

## 2018-07-28 DIAGNOSIS — K2100 Gastro-esophageal reflux disease with esophagitis, without bleeding: Secondary | ICD-10-CM

## 2018-07-28 DIAGNOSIS — R194 Change in bowel habit: Secondary | ICD-10-CM

## 2018-07-28 DIAGNOSIS — R12 Heartburn: Secondary | ICD-10-CM

## 2018-07-28 DIAGNOSIS — K529 Noninfective gastroenteritis and colitis, unspecified: Secondary | ICD-10-CM

## 2018-07-28 DIAGNOSIS — R1310 Dysphagia, unspecified: Secondary | ICD-10-CM

## 2018-07-28 MED ORDER — ESOMEPRAZOLE MAGNESIUM 40 MG PO CPDR: 40.0000 mg | DELAYED_RELEASE_CAPSULE | Freq: Two times a day (BID) | ORAL | 1 refills | Status: DC

## 2018-07-28 NOTE — Telephone Encounter (Signed)
Colonoscopy has been added to Endo on 08/13/2018. Will resend instructions to include colonoscopy and prep.

## 2018-07-28 NOTE — Patient Instructions (Addendum)
Continue Omeprazole 40mg  twice daily.   Start over the counter Pepcid 40mg  - once at bedtime.    Your provider has requested that you go to the basement level for lab work before leaving today. Press "B" on the elevator. The lab is located at the first door on the left as you exit the elevator.  You have been scheduled for a modified barium swallow on 08/06/2018 at 1:00pm. Please arrive 15 minutes prior to your test for registration. You will go to [facility] (1st Floor) for your appointment. Should you need to cancel or reschedule your appointment, please contact [phone] [facility] [place]) or [phone] [place]). _____________________________________________________________________ A Modified Barium Swallow Study, or MBS, is a special x-ray that is taken to check swallowing skills. It is carried out by a Stage manager and a Psychologist, clinical (SLP). During this test, yourmouth, throat, and esophagus, a muscular tube which connects your mouth to your stomach, is checked. The test will help you, your doctor, and the SLP plan what types of foods and liquids are easier for you to swallow. The SLP will also identify positions and ways to help you swallow more easily and safely. What will happen during an MBS? You will be taken to an x-ray room and seated comfortably. You will be asked to swallow small amounts of food and liquid mixed with barium. Barium is a liquid or paste that allows images of your mouth, throat and esophagus to be seen on x-ray. The x-ray captures moving images of the food you are swallowing as it travels from your mouth through your throat and into your esophagus. This test helps identify whether food or liquid is entering your lungs (aspiration). The test also shows which part of your mouth or throat lacks strength or coordination to move the food or liquid in the right direction. This test typically takes 30 minutes to 1 hour to  complete. _______________________________________________________________________  Thank you for choosing me and [facility].  Dr. Rush Landmark

## 2018-07-28 NOTE — Telephone Encounter (Signed)
The pt called to get the phone number to scheduling incase he needed to change the MBSS appt.  I did provide him with that number.

## 2018-07-28 NOTE — Telephone Encounter (Signed)
Patient called in requesting to speak with the nurse. I asked patient the nature of call he just stated that he would like to speak with the nurse regarding his question.

## 2018-07-28 NOTE — Progress Notes (Signed)
[facility] VISIT   Primary Care Provider Pomposini, Cherly Anderson, MD No address on file [phone]  Referring Provider PCP as above  Patient Profile: Colton Walker is a 43 y.o. male with a pmh significant for hx of hypertension, hyperlipidemia, sleep apnea (on CPAP), seasonal allergies, osteoarthritis, prior TIA (on aspirin), longstanding GERD.  The patient presents to the [facility] for an evaluation and management of problem(s) noted below:  Problem List 1. Gastroesophageal reflux disease with esophagitis   2. Dysphagia, unspecified type   3. Pyrosis   4. Chronic diarrhea     I connected with  Peri Jefferson on 07/28/18. I verified that I was speaking with the correct person using two identifiers. Due to the COVID-19 Pandemic, this service was provided via telemedicine using Audio/Visual Media. The patient was located at his work. The provider was located in the office. The patient did consent to this visit and is aware of charges through their insurance as well as the limitations of evaluation and management by telemedicine. Other persons participating in this telemedicine service were none. Time spent on visit was 25 minutes on phone and in coordination of care   History of Present Illness: Please see initial consultation note and progress notes for full details of HPI.    Interval History This was a scheduled follow-up with the patient.  Overall he feels that he has continued to make improvement while being on PPI therapy.  However, we had tried to transition him to [facility] but it was too expensive for him.  We then sent a prescription for Nexium however he never obtained this and does not describe why.  He has subsequently remained on omeprazole and is only taking it once daily as we had previously asked him to do.  He underwent a pH impedance test as well manometry.  pH impedance test showed evidence of acid reflux with a  DeMeester score greater than 45.  The manometry was unremarkable as noted below.  The patient describes having dysphagia still.  This occurs approximately 25% of the time.  He is not regurgitating as frequently as before.  He denies any hematemesis or coffee-ground emesis.  The patient still having loose bowel movements approximately 4 to 5/day.  There is no blood in the stools however when he wipes he notices it 50% of the time.  He denies any straining.  Because of his acid reflux he feels he is only able to get approximately 3 hours of sleep per night.  This is making things difficult on him.  He denies any abdominal pain or chest pain.    GI Review of Systems Positive as above Negative for odynophagia, globus, nausea, melena  Review of Systems General: Denies fevers/chills/weight loss Cardiovascular: Denies chest pain Pulmonary: Denies shortness of breath Gastroenterological: See HPI  Genitourinary: Denies darkened urine Hematological: Denies easy bruising Dermatological: Denies jaundice Psychological: Mood remains anxious about his GI symptoms and desire of improving.   Medications Current Outpatient Medications  Medication Sig Dispense Refill  . aspirin EC 81 MG tablet Take 81 mg by mouth daily.    . Cyanocobalamin (B-12) 1000 MCG TABS Take 1 tablet by mouth daily.    Marland Kitchen esomeprazole (NEXIUM) 40 MG capsule Take 1 capsule (40 mg total) by mouth 2 (two) times daily before a meal. 60 capsule 1  . lisinopril (PRINIVIL,ZESTRIL) 10 MG tablet Take 10 mg by mouth daily.    . multivitamin (ONE-A-DAY MEN'S) TABS tablet Take 1 tablet by mouth  daily.    . omeprazole (PRILOSEC) 40 MG capsule TAKE 1 CAPSULE(40 MG) BY MOUTH TWICE DAILY 60 capsule 2  . propranolol (INDERAL) 20 MG tablet Take 20 mg by mouth 3 (three) times daily.    . rosuvastatin (CRESTOR) 10 MG tablet Take 10 mg by mouth daily.     No current facility-administered medications for this visit.     Allergies Allergies  Allergen  Reactions  . Lortab [Hydrocodone-Acetaminophen]   . Morphine Nausea Only  . Penicillins     Histories Past Medical History:  Diagnosis Date  . Allergy   . Arthritis   . GERD (gastroesophageal reflux disease)   . HTN (hypertension)   . Hyperlipidemia   . OSA (obstructive sleep apnea)    uses cpap  . Sleep apnea    Past Surgical History:  Procedure Laterality Date  . ESOPHAGEAL MANOMETRY N/A 07/20/2018   Procedure: ESOPHAGEAL MANOMETRY (EM);  Surgeon: Irving Copas., MD;  Location: [facility];  Service: Gastroenterology;  Laterality: N/A;  . [place] IMPEDANCE STUDY  07/20/2018   Procedure: [place] IMPEDANCE STUDY;  Surgeon: Rush Landmark Telford Nab., MD;  Location: [facility];  Service: Gastroenterology;;  . TIBIA FRACTURE SURGERY Right    Social History   Socioeconomic History  . Marital status: Married    Spouse name: Not on file  . Number of children: Not on file  . Years of education: Not on file  . Highest education level: Not on file  Occupational History  . Not on file  Social Needs  . Financial resource strain: Not on file  . Food insecurity    Worry: Not on file    Inability: Not on file  . Transportation needs    Medical: Not on file    Non-medical: Not on file  Tobacco Use  . Smoking status: Current Every Day Smoker    Packs/day: 0.50    Types: Cigarettes  . Smokeless tobacco: Never Used  Substance and Sexual Activity  . Alcohol use: Never    Frequency: Never  . Drug use: Never  . Sexual activity: Not on file  Lifestyle  . Physical activity    Days per week: Not on file    Minutes per session: Not on file  . Stress: Not on file  Relationships  . Social Herbalist on phone: Not on file    Gets together: Not on file    Attends religious service: Not on file    Active member of club or organization: Not on file    Attends meetings of clubs or organizations: Not on file    Relationship status: Not on file  . Intimate partner violence     Fear of current or ex partner: Not on file    Emotionally abused: Not on file    Physically abused: Not on file    Forced sexual activity: Not on file  Other Topics Concern  . Not on file  Social History Narrative  . Not on file   Family History  Problem Relation Age of Onset  . Hypertension Mother   . Heart disease Father   . Hypertension Father   . Colon cancer Neg Hx   . Esophageal cancer Neg Hx   . Stomach cancer Neg Hx   . Rectal cancer Neg Hx   . Inflammatory bowel disease Neg Hx   . Liver disease Neg Hx   . Pancreatic cancer Neg Hx    I have reviewed his medical, social,  and family history in detail and updated the electronic medical record as necessary.    PHYSICAL EXAMINATION  Telehealth visit   REVIEW OF DATA  I reviewed the following data at the time of this encounter:  GI Procedures and Studies  June 2020 pH study off PPI   June 2020 esophageal manometry   Laboratory Studies  Reviewed outside records  Imaging Studies  No relevant studies to review   ASSESSMENT  Mr. Ragas is a 43 y.o. male with a pmh significant for hx of hypertension, hyperlipidemia, sleep apnea (on CPAP), seasonal allergies, osteoarthritis, prior TIA (on aspirin), longstanding GERD.  The patient is seen today for evaluation and management of:  1. Gastroesophageal reflux disease with esophagitis   2. Dysphagia, unspecified type   3. Pyrosis   4. Chronic diarrhea    The patient is hemodynamically stable however the patient remains plagued with clinical heartburn.  His pH impedance testing is consistent with acid reflux changes.  We have gone through 2 medications with him previously being on Protonix and then transition to omeprazole.  We are going to do 1 more transition and PPI as well as add an H2 blocker at evening time to see how things go.  However, I think the patient is now becoming 1 that will likely need further intervention from a surgical or endoscopic perspective.  His  dysphagia persists but is improved while having good acid suppression medication.  As such even though his manometry is normal I do think it would be reasonable for [place] to consider an empiric dilation to see how his symptoms are mitigated if at all.  I would like to pursue this endoscopy with dilation in the coming weeks.  After this, I think he will benefit from consideration of evaluation by Dr. Bryan Lemma for possible TIF and an evaluation by our surgical colleagues to consider role for Nissen fundoplication.  I did discuss with him, that I want to try and minimize the risk of having persistent dysphagia issues prior to any sort of intervention and his manometry is unremarkable for an EGJ outflow obstruction so I do not think we should have problems but it is something we need to consider.  As it has been over 4 weeks now that the patient is continuing to have loose stools he needs further evaluation of this.  If his stool studies are unremarkable as ordered then we will proceed with a diagnostic colonoscopy.  He will continue his fiber supplementation.  The risks and benefits of endoscopic evaluation were discussed with the patient; these include but are not limited to the risk of perforation, infection, bleeding, missed lesions, lack of diagnosis, severe illness requiring hospitalization, as well as anesthesia and sedation related illnesses.  The patient is agreeable to proceed.  All patient questions were answered, to the best of my ability, and the patient agrees to the aforementioned plan of action with follow-up as indicated.   PLAN  Continue omeprazole 40 mg twice daily for now We will send new prescription for Nexium 40 mg twice daily (if issues will consider Dexilant samples versus prior authorization if necessary) Start Pepcid 40 mg nightly Modified barium swallow to be performed (low pretest probability of issues)  Plan for empiric endoscopy with dilation After EGD we will proceed with referral  to surgery for possible fundoplication as well as to Dr. Bryan Lemma to consider TIF and allow patient to make decision Stool studies as outlined below If negative stool studies will proceed with diagnostic colonoscopy  Orders Placed This Encounter  Procedures  . Stool Culture  . Ova and parasite examination  . TSH  . Sedimentation rate  . CRP High sensitivity  . IgA  . Tissue transglutaminase, IgA  . Clostridium difficile Toxin B, Qualitative, Real-Time PCR  . Stool, WBC/Lactoferrin  . Fecal fat, quantitative  . Pancreatic Elastase, Fecal  . Ambulatory referral to Gastroenterology  . SLP modified barium swallow    New Prescriptions   No medications on file   Modified Medications   Modified Medication Previous Medication   ESOMEPRAZOLE (NEXIUM) 40 MG CAPSULE esomeprazole (NEXIUM) 40 MG capsule      Take 1 capsule (40 mg total) by mouth 2 (two) times daily before a meal.    Take 1 capsule (40 mg total) by mouth 2 (two) times daily before a meal.    Planned Follow Up: No follow-ups on file.   Justice Britain, MD [facility] Office # [phone]

## 2018-07-29 MED ORDER — PEG 3350-KCL-NA BICARB-NACL 420 G PO SOLR: 4000.0000 mL | Freq: Once | ORAL | 0 refills | Status: AC

## 2018-07-29 NOTE — Telephone Encounter (Signed)
Pt informed. Instructions mailed. Rx for prep sent to pharmacy.

## 2018-07-29 NOTE — Telephone Encounter (Signed)
-----   Message from Irving Copas., MD sent at 07/28/2018  3:02 PM EDT ----- Regarding: Follow up Colton Walker,As I was finalizing his note I do think that it would be reasonable for [place] to pursue a diagnostic colonoscopy as long as all of his stool studies come back negative.I called and spoke with the patient about this and he agrees to this.I would go ahead and add on a diagnostic colonoscopy for chronic diarrhea evaluation to his planned endoscopic with dilation.Thank you.GM

## 2018-08-05 DIAGNOSIS — K5790 Diverticulosis of intestine, part unspecified, without perforation or abscess without bleeding: Secondary | ICD-10-CM

## 2018-08-05 HISTORY — DX: Diverticulosis of intestine, part unspecified, without perforation or abscess without bleeding: K57.90

## 2018-08-05 HISTORY — PX: UPPER GI ENDOSCOPY: SHX6162

## 2018-08-05 HISTORY — PX: COLONOSCOPY: SHX174

## 2018-08-06 ENCOUNTER — Ambulatory Visit (HOSPITAL_COMMUNITY)
Admission: RE | Admit: 2018-08-06 | Discharge: 2018-08-06 | Disposition: A | Discharge status: Home / Self Care | Payer: BC Managed Care – PPO | Source: Ambulatory Visit | Attending: Gastroenterology | Admitting: Gastroenterology

## 2018-08-06 ENCOUNTER — Other Ambulatory Visit: Payer: Self-pay

## 2018-08-06 DIAGNOSIS — K2100 Gastro-esophageal reflux disease with esophagitis, without bleeding: Secondary | ICD-10-CM

## 2018-08-06 DIAGNOSIS — K529 Noninfective gastroenteritis and colitis, unspecified: Secondary | ICD-10-CM | POA: Diagnosis present

## 2018-08-06 DIAGNOSIS — R1310 Dysphagia, unspecified: Secondary | ICD-10-CM | POA: Diagnosis not present

## 2018-08-11 ENCOUNTER — Ambulatory Visit: Payer: BC Managed Care – PPO | Admitting: Gastroenterology

## 2018-08-12 ENCOUNTER — Telehealth: Payer: Self-pay | Admitting: Gastroenterology

## 2018-08-12 NOTE — Telephone Encounter (Signed)
Do you now or have you had a fever in the last 14 days?     No   Do you have any respiratory symptoms of shortness of breath or cough now or in the last 14 days?    No   Do you have any family members or close contacts with diagnosed or suspected Covid-19 in the past 14 days?  No   Have you been tested for Covid-19 and found to be positive?    No   Pt made aware of that care partner may come to the lobby during the procedure but will need to provide their own mask.

## 2018-08-12 NOTE — Telephone Encounter (Signed)
No answer and mailbox is not setup Covid-19 Screening Questions:  Do you now or have you had a fever in the last 14 days?   Do you have any respiratory symptoms of shortness of breath or cough now or in the last 14 days?   Do you have any family members or close contacts with diagnosed or suspected Covid-19 in the past 14 days?   Have you been tested for Covid-19 and found to be positive?

## 2018-08-13 ENCOUNTER — Ambulatory Visit (AMBULATORY_SURGERY_CENTER): Payer: BC Managed Care – PPO | Admitting: Gastroenterology

## 2018-08-13 ENCOUNTER — Encounter: Payer: BC Managed Care – PPO | Admitting: Gastroenterology

## 2018-08-13 ENCOUNTER — Other Ambulatory Visit: Payer: Self-pay

## 2018-08-13 ENCOUNTER — Encounter: Payer: Self-pay | Admitting: Gastroenterology

## 2018-08-13 VITALS — BP 120/66 | HR 58 | Temp 98.5°F | Resp 12 | Ht 75.0 in | Wt 215.0 lb

## 2018-08-13 DIAGNOSIS — R194 Change in bowel habit: Secondary | ICD-10-CM

## 2018-08-13 DIAGNOSIS — R1310 Dysphagia, unspecified: Secondary | ICD-10-CM

## 2018-08-13 DIAGNOSIS — K635 Polyp of colon: Secondary | ICD-10-CM

## 2018-08-13 DIAGNOSIS — D127 Benign neoplasm of rectosigmoid junction: Secondary | ICD-10-CM

## 2018-08-13 DIAGNOSIS — K5730 Diverticulosis of large intestine without perforation or abscess without bleeding: Secondary | ICD-10-CM

## 2018-08-13 DIAGNOSIS — K648 Other hemorrhoids: Secondary | ICD-10-CM

## 2018-08-13 DIAGNOSIS — D128 Benign neoplasm of rectum: Secondary | ICD-10-CM

## 2018-08-13 DIAGNOSIS — K2100 Gastro-esophageal reflux disease with esophagitis, without bleeding: Secondary | ICD-10-CM

## 2018-08-13 DIAGNOSIS — K621 Rectal polyp: Secondary | ICD-10-CM | POA: Diagnosis not present

## 2018-08-13 DIAGNOSIS — D129 Benign neoplasm of anus and anal canal: Secondary | ICD-10-CM

## 2018-08-13 MED ORDER — SODIUM CHLORIDE 0.9 % IV SOLN: 500.0000 mL | Freq: Once | INTRAVENOUS | Status: DC

## 2018-08-13 NOTE — Progress Notes (Signed)
Pt Drowsy. VSS. To PACU, report to RN. No anesthetic complications noted.  

## 2018-08-13 NOTE — Progress Notes (Signed)
Called to room to assist during endoscopic procedure.  Patient ID and intended procedure confirmed with present staff. Received instructions for my participation in the procedure from the performing physician.  

## 2018-08-13 NOTE — Op Note (Signed)
[facility] Patient Name: Colton Walker Procedure Date: 08/13/2018 2:10 PM MRN: [id number] Endoscopist: Justice Britain , MD Age: 43 Referring MD:  Date of Birth: [birth year] Gender: Male Account #: [id number] Procedure:                Colonoscopy Indications:              Chronic diarrhea Medicines:                Monitored Anesthesia Care Procedure:                Pre-Anesthesia Assessment:                           - Prior to the procedure, a History and Physical                            was performed, and patient medications and                            allergies were reviewed. The patient's tolerance of                            previous anesthesia was also reviewed. The risks                            and benefits of the procedure and the sedation                            options and risks were discussed with the patient.                            All questions were answered, and informed consent                            was obtained. Prior Anticoagulants: The patient has                            taken no previous anticoagulant or antiplatelet                            agents. ASA Grade Assessment: II - A patient with                            mild systemic disease. After reviewing the risks                            and benefits, the patient was deemed in                            satisfactory condition to undergo the procedure.                           After obtaining informed consent, the colonoscope  was passed under direct vision. Throughout the                            procedure, the patient's blood pressure, pulse, and                            oxygen saturations were monitored continuously. The                            Colonoscope was introduced through the anus and                            advanced to the 8 cm into the ileum. The                            colonoscopy was performed without difficulty. The                             patient tolerated the procedure. The quality of the                            bowel preparation was good. The terminal ileum,                            ileocecal valve, appendiceal orifice, and rectum                            were photographed. Scope In: 2:28:34 PM Scope Out: 2:53:10 PM Scope Withdrawal Time: 0 hours 19 minutes 37 seconds  Total Procedure Duration: 0 hours 24 minutes 36 seconds  Findings:                 External hemorrhoids were found on perianal exam.                           The terminal ileum contained a few scattered                            non-bleeding erosions. No stigmata of recent                            bleeding were seen. Biopsies were taken with a cold                            forceps for histology to rule out IBD.                           The ileocecal valve appeared normal.                           Three sessile polyps were found in the rectum (1)                            and recto-sigmoid colon (2). The polyps were 2 to 6  mm in size. These polyps were removed with a cold                            snare. Resection and retrieval were complete.                           Normal mucosa was found in the entire colon                            otherwise. Biopsies for histology were taken with a                            cold forceps from the entire colon for evaluation                            of microscopic colitis and chronic colitis rule out.                           A few small-mouthed diverticula were found in the                            recto-sigmoid colon, sigmoid colon, descending                            colon and transverse colon.                           Non-bleeding non-thrombosed external and internal                            hemorrhoids were found during retroflexion, during                            perianal exam and during digital exam. The                             hemorrhoids were Grade I (internal hemorrhoids that                            do not prolapse). Complications:            No immediate complications. Estimated Blood Loss:     Estimated blood loss was minimal. Impression:               - Hemorrhoids found on perianal exam.                           - A few erosions in the terminal ileum. Biopsied.                           - The examined portion of the ileum was normal.                           - Three 2 to 6 mm polyps in the rectum and at the  recto-sigmoid colon, removed with a cold snare.                            Resected and retrieved.                           - Normal mucosa in the entire examined colon.                            Biopsied.                           - Diverticulosis in the recto-sigmoid colon, in the                            sigmoid colon, in the descending colon and in the                            transverse colon.                           - Non-bleeding non-thrombosed external and internal                            hemorrhoids. Recommendation:           - The patient will be observed post-procedure,                            until all discharge criteria are met.                           - Discharge patient to home.                           - Patient has a contact number available for                            emergencies. The signs and symptoms of potential                            delayed complications were discussed with the                            patient. Return to normal activities tomorrow.                            Written discharge instructions were provided to the                            patient.                           - High fiber diet.                           - Continue present medications.                           -  Await pathology results.                           - Repeat colonoscopy for surveillance based on                             pathology results and if findings of adenomatous                            tissue to dictate surveillance protocol.                           - Please pick up the Stool kit to complete the                            stool studies that we want to complete your workup.                            Do not give a stool sample for at least 1.5-2 weeks                            after the colonoscopy today.                           - When patient returns to drop off the stool                            studies, he needs to complete the blood work that                            has been ordered.                           - Follow up dictated by pathology in 4-6 weeks.                           - The findings and recommendations were discussed                            with the patient. Justice Britain, MD 08/13/2018 3:08:05 PM

## 2018-08-13 NOTE — Patient Instructions (Signed)
You have been given your stool kit. Please, wait two weeks to complete the test.  Also get your bloodwork at that time here at th  Follow the directions, and if you have questions, call the office.  Plase have your bloodwork done that time too.  YOU HAD AN ENDOSCOPIC PROCEDURE TODAY AT [facility]:   Refer to the procedure report that was given to you for any specific questions about what was found during the examination.  If the procedure report does not answer your questions, please call your gastroenterologist to clarify.  If you requested that your care partner not be given the details of your procedure findings, then the procedure report has been included in a sealed envelope for you to review at your convenience later.  YOU SHOULD EXPECT: Some feelings of bloating in the abdomen. Passage of more gas than usual.  Walking can help get rid of the air that was put into your GI tract during the procedure and reduce the bloating. If you had a lower endoscopy (such as a colonoscopy or flexible sigmoidoscopy) you may notice spotting of blood in your stool or on the toilet paper. If you underwent a bowel prep for your procedure, you may not have a normal bowel movement for a few days.  Please Note:  You might notice some irritation and congestion in your nose or some drainage.  This is from the oxygen used during your procedure.  There is no need for concern and it should clear up in a day or so.  SYMPTOMS TO REPORT IMMEDIATELY:   Following lower endoscopy (colonoscopy or flexible sigmoidoscopy):  Excessive amounts of blood in the stool  Significant tenderness or worsening of abdominal pains  Swelling of the abdomen that is new, acute  Fever of 100F or higher   Following upper endoscopy (EGD)  Vomiting of blood or coffee ground material  New chest pain or pain under the shoulder blades  Painful or persistently difficult swallowing  New shortness of breath  Fever of 100F or  higher  Black, tarry-looking stools  For urgent or emergent issues, a gastroenterologist can be reached at any hour by calling [phone].   DIET:  We do recommend that you follow the diet on the sheet given to you.  Drink plenty of fluids but you should avoid alcoholic beverages for 24 hours. After tomorrow, eat  High fiber diet.  ACTIVITY:  You should plan to take it easy for the rest of today and you should NOT DRIVE or use heavy machinery until tomorrow (because of the sedation medicines used during the test).    FOLLOW UP: Our staff will call the number listed on your records 48-72 hours following your procedure to check on you and address any questions or concerns that you may have regarding the information given to you following your procedure. If we do not reach you, we will leave a message.  We will attempt to reach you two times.  During this call, we will ask if you have developed any symptoms of COVID 19. If you develop any symptoms (ie: fever, flu-like symptoms, shortness of breath, cough etc.) before then, please call [phone].  If you test positive for Covid 19 in the 2 weeks post procedure, please call and report this information to [place].    If any biopsies were taken you will be contacted by phone or by letter within the next 1-3 weeks.  Please call us at [phone] if you  have not heard about the biopsies in 3 weeks.    SIGNATURES/CONFIDENTIALITY: You and/or your care partner have signed paperwork which will be entered into your electronic medical record.  These signatures attest to the fact that that the information above on your After Visit Summary has been reviewed and is understood.  Full responsibility of the confidentiality of this discharge information lies with you and/or your care-partner.

## 2018-08-13 NOTE — Op Note (Signed)
[facility] Patient Name: Colton Walker Procedure Date: 08/13/2018 2:11 PM MRN: [id number] Endoscopist: Justice Britain , MD Age: 43 Referring MD:  Date of Birth: March 21, 1975 Gender: Male Account #: [id number] Procedure:                Upper GI endoscopy Indications:              Dysphagia Medicines:                Monitored Anesthesia Care Procedure:                Pre-Anesthesia Assessment:                           - Prior to the procedure, a History and Physical                            was performed, and patient medications and                            allergies were reviewed. The patient's tolerance of                            previous anesthesia was also reviewed. The risks                            and benefits of the procedure and the sedation                            options and risks were discussed with the patient.                            All questions were answered, and informed consent                            was obtained. Prior Anticoagulants: The patient has                            taken no previous anticoagulant or antiplatelet                            agents. ASA Grade Assessment: II - A patient with                            mild systemic disease. After reviewing the risks                            and benefits, the patient was deemed in                            satisfactory condition to undergo the procedure.                           After obtaining informed consent, the endoscope was  passed under direct vision. Throughout the                            procedure, the patient's blood pressure, pulse, and                            oxygen saturations were monitored continuously. The                            Model GIF-HQ190 [id number]) scope was introduced                            through the mouth, and advanced to the second part                            of duodenum. The upper GI endoscopy was                             accomplished without difficulty. The patient                            tolerated the procedure. Scope In: Scope Out: Findings:                 No gross lesions were noted in the entire                            esophagus. A guidewire was placed and the scope was                            withdrawn. Dilation was performed with a Savary                            dilator with no resistance at 16 mm, 17 mm and 18                            mm. The dilation site was examined following                            endoscope reinsertion and showed no change and no                            bleeding, mucosal tear or perforation.                           No gross lesions were noted in the entire examined                            stomach.                           No gross lesions were noted in the duodenal bulb,  in the first portion of the duodenum and in the                            second portion of the duodenum. Complications:            No immediate complications. Estimated Blood Loss:     Estimated blood loss: none. Impression:               - No gross lesions in esophagus. Dilated.                           - No gross lesions in the stomach.                           - No gross lesions in the duodenal bulb, in the                            first portion of the duodenum and in the second                            portion of the duodenum. Recommendation:           - Proceed to scheduled colonoscopy.                           - Continue PPI 40 mg BID.                           - Based on patient's symptoms after dilation and                            GERD symptomatology, will consider role in follow                            up for possible Endoscopic v Surgical                            Fundoplication vs continue medication management.                           - Soft diet x 24 hours.                           - The findings  and recommendations were discussed                            with the patient. Justice Britain, MD 08/13/2018 3:00:41 PM

## 2018-08-14 ENCOUNTER — Telehealth: Payer: Self-pay | Admitting: Gastroenterology

## 2018-08-14 NOTE — Telephone Encounter (Signed)
I called and spoke with this patient around 130 this afternoon. Patient states that he is feeling slightly better than earlier in the day. He is having bowel movements with some blood admixed which would be expected in the setting of the biopsies that we obtain. Very low likelihood of underlying complication from the procedure, however, I do think that he needs to be very mindful of things.  He denies any fevers or chills.  No issues with nausea or vomiting.  He was able to tolerate meals yesterday into today. I offered the patient to come in and obtain basic labs as well as a KUB to ensure that we do not see any evidence of a free perforation which I think is very unlikely but something to consider.  After offering this to the patient he declined and said that he thinks he will be able to try and wait it out. I think he is likely correct, however, I was very adamant with him that if he chooses to do that rather than coming into the office to be evaluated that he needs to be very vigilant. If he has any persistence of pain into this evening or worsening of pain into this evening or tomorrow that he should have his wife someone take him to the hospital to be evaluated further. He agrees with this plan of action and will plan to come to [facility] or [facility] if necessary over the weekend or this evening if things worsen. We will have our RNs reach out to him early next week should he not end up coming into the hospital. Patient notes that our GI team is on the entire weekend and he should feel free to reach out to [place] if it anytime he has further issues. He is appreciative for the call back and anticipates that he will feel better. Patty or covering RN please reach out to the patient on Monday and see how he is done over the weekend  Justice Britain, MD [facility] Advanced Endoscopy Office # [phone]

## 2018-08-14 NOTE — Telephone Encounter (Signed)
Pt had colonoscopy yesterday and is now experiencing abdominal pain.

## 2018-08-14 NOTE — Telephone Encounter (Signed)
Returned pts call.  He states that he is having pain 8/10 today.  Describes it as cramping.  He has had 2 BM's today with a "little bit " of dark blood mixed in the stool.  He states that he has only passed a small amount of air today.  He denies fever.  I suggested he move around or try to change positions frequently and also drink warm liquids to see if he could expel more air. Also suggested sitting on the toilet.  Please advise.

## 2018-08-17 ENCOUNTER — Telehealth: Payer: Self-pay

## 2018-08-17 ENCOUNTER — Other Ambulatory Visit (INDEPENDENT_AMBULATORY_CARE_PROVIDER_SITE_OTHER): Payer: BC Managed Care – PPO

## 2018-08-17 ENCOUNTER — Other Ambulatory Visit: Payer: Self-pay

## 2018-08-17 ENCOUNTER — Ambulatory Visit (INDEPENDENT_AMBULATORY_CARE_PROVIDER_SITE_OTHER)
Admission: RE | Admit: 2018-08-17 | Discharge: 2018-08-17 | Disposition: A | Discharge status: Home / Self Care | Payer: BC Managed Care – PPO | Source: Ambulatory Visit | Attending: Gastroenterology | Admitting: Gastroenterology

## 2018-08-17 DIAGNOSIS — Z9889 Other specified postprocedural states: Secondary | ICD-10-CM

## 2018-08-17 DIAGNOSIS — K529 Noninfective gastroenteritis and colitis, unspecified: Secondary | ICD-10-CM | POA: Diagnosis not present

## 2018-08-17 DIAGNOSIS — K625 Hemorrhage of anus and rectum: Secondary | ICD-10-CM

## 2018-08-17 DIAGNOSIS — K2100 Gastro-esophageal reflux disease with esophagitis, without bleeding: Secondary | ICD-10-CM

## 2018-08-17 DIAGNOSIS — R109 Unspecified abdominal pain: Secondary | ICD-10-CM

## 2018-08-17 LAB — CBC WITH DIFFERENTIAL/PLATELET
Basophils Absolute: 0.1 10*3/uL (ref 0.0–0.1)
Basophils Relative: 1 % (ref 0.0–3.0)
Eosinophils Absolute: 0.2 10*3/uL (ref 0.0–0.7)
Eosinophils Relative: 2.1 % (ref 0.0–5.0)
HCT: 43.9 % (ref 39.0–52.0)
Hemoglobin: 15 g/dL (ref 13.0–17.0)
Lymphocytes Relative: 26.1 % (ref 12.0–46.0)
Lymphs Abs: 3 10*3/uL (ref 0.7–4.0)
MCHC: 34.1 g/dL (ref 30.0–36.0)
MCV: 91.9 fl (ref 78.0–100.0)
Monocytes Absolute: 0.9 10*3/uL (ref 0.1–1.0)
Monocytes Relative: 7.8 % (ref 3.0–12.0)
Neutro Abs: 7.3 10*3/uL (ref 1.4–7.7)
Neutrophils Relative %: 63 % (ref 43.0–77.0)
Platelets: 347 10*3/uL (ref 150.0–400.0)
RBC: 4.78 Mil/uL (ref 4.22–5.81)
RDW: 13.6 % (ref 11.5–15.5)
WBC: 11.7 10*3/uL — ABNORMAL HIGH (ref 4.0–10.5)

## 2018-08-17 LAB — COMPREHENSIVE METABOLIC PANEL
ALT: 34 U/L (ref 0–53)
AST: 24 U/L (ref 0–37)
Albumin: 5.4 g/dL — ABNORMAL HIGH (ref 3.5–5.2)
Alkaline Phosphatase: 84 U/L (ref 39–117)
BUN: 23 mg/dL (ref 6–23)
CO2: 22 mEq/L (ref 19–32)
Calcium: 10.3 mg/dL (ref 8.4–10.5)
Chloride: 103 mEq/L (ref 96–112)
Creatinine, Ser: 1.11 mg/dL (ref 0.40–1.50)
GFR: 72.65 mL/min (ref >60.00)
Glucose, Bld: 100 mg/dL — ABNORMAL HIGH (ref 70–99)
Potassium: 4.1 mEq/L (ref 3.5–5.1)
Sodium: 138 mEq/L (ref 135–145)
Total Bilirubin: 0.6 mg/dL (ref 0.2–1.2)
Total Protein: 8.4 g/dL — ABNORMAL HIGH (ref 6.0–8.3)

## 2018-08-17 LAB — HIGH SENSITIVITY CRP: CRP, High Sensitivity: 2.67 mg/L (ref 0.000–5.000)

## 2018-08-17 LAB — IGA: IgA: 156 mg/dL (ref 68–378)

## 2018-08-17 LAB — SEDIMENTATION RATE: Sed Rate: 19 mm/hr — ABNORMAL HIGH (ref 0–15)

## 2018-08-17 LAB — TSH: TSH: 2.11 u[IU]/mL (ref 0.35–4.50)

## 2018-08-17 NOTE — Telephone Encounter (Signed)
Covid-19 screening questions   Do you now or have you had a fever in the last 14 days? No  Do you have any respiratory symptoms of shortness of breath or cough now or in the last 14 days? No  Do you have any family members or close contacts with diagnosed or suspected Covid-19 in the past 14 days? No  Have you been tested for Covid-19 and found to be positive? No      Follow up Call-  Call back number 08/13/2018 04/16/2018  Post procedure Call Back phone  # [phone]  Permission to leave phone message Yes Yes     Patient questions:  Do you have a fever, pain , or abdominal swelling? Yes.   Pain Score  7 *  Have you tolerated food without any problems? No.  Have you been able to return to your normal activities? Yes.    Do you have any questions about your discharge instructions: Diet   No. Medications  Yes.   Follow up visit  Yes.    Do you have questions or concerns about your Care? Yes.  Pt. Reports his intermittent lower GI pain has continued post-procedure.  It has not worsened.  May be slightly, but not notably improved, remaining at a level 7.  Pt. Reports he had blood in stool 3 times post procedure, about a Tablespoon each time.  Pt. Reports he spoke to Dr. Early Osmond Friday, he did purchase Gas X, but received no improvement.   As far as diet, he reports he has had no appetite post-procedure.  Told pt. I would forward the results of our conversation to Dr. Rush Landmark.   Told pt. To definitely contact Dr. Donneta Romberg office if he was ready to pursue blood work and x-ray as Dr. Rush Landmark suggested to him as an option Friday.  At that time the pt. Said he wanted to wait and see if he got better.  Actions: * If pain score is 4 or above: Physician/ provider Notified : Justice Britain, MD.

## 2018-08-17 NOTE — Telephone Encounter (Signed)
At this point with issues persisting over weekend, he needs to be evaluated further. Please move forward with the following laboratories:  CBC/CMP/CRP/ESR Please set up a CT-Abdomen/Pelvis with IV/PO contrast for today; if that cannot be coordinated today then he needs a CXR/KUB (3-View - Supine/Upright/Decubitus) at minimum today and CT scheduled thereafter. Patty or Covering RN, please move forward with plan as above.  Justice Britain, MD [facility] Advanced Endoscopy Office # [phone]

## 2018-08-17 NOTE — Telephone Encounter (Signed)
The pt has been advised and will have xray today as well as labs.  He has also been advised and instructed for CT.  He will pick up contrast today .     You have been scheduled for a CT scan of the abdomen and pelvis at [facility] ([address]---this is in the same building as [facility]).   You are scheduled on 08/21/18 at 915 am. You should arrive 15 minutes prior to your appointment time for registration. Please follow the written instructions below on the day of your exam:  WARNING: IF YOU ARE ALLERGIC TO IODINE/X-RAY DYE, PLEASE NOTIFY RADIOLOGY IMMEDIATELY AT [phone]! YOU WILL BE GIVEN A 13 HOUR PREMEDICATION PREP.  1) Do not eat or drink anything after 515 am (4 hours prior to your test) 2) You have been given 2 bottles of oral contrast to drink. The solution may taste better if refrigerated, but do NOT add ice or any other liquid to this solution. Shake well before drinking.    Drink 1 bottle of contrast @ 7 am (2 hours prior to your exam)  Drink 1 bottle of contrast @ 8 am (1 hour prior to your exam)  You may take any medications as prescribed with a small amount of water, if necessary. If you take any of the following medications: METFORMIN, GLUCOPHAGE, GLUCOVANCE, AVANDAMET, RIOMET, FORTAMET, [place] MET, JANUMET, GLUMETZA or METAGLIP, you MAY be asked to HOLD this medication 48 hours AFTER the exam.  The purpose of you drinking the oral contrast is to aid in the visualization of your intestinal tract. The contrast solution may cause some diarrhea. Depending on your individual set of symptoms, you may also receive an intravenous injection of x-ray contrast/dye. Plan on being at [facility] for 30 minutes or longer, depending on the type of exam you are having performed.  This test typically takes 30-45 minutes to complete.  If you have any questions regarding your exam or if you need to reschedule, you may call the CT department at [phone]  between the hours of 8:00 am and 5:00 pm, Monday-Friday.  _____________________________________

## 2018-08-17 NOTE — Telephone Encounter (Signed)
See alternate phone note  

## 2018-08-18 ENCOUNTER — Telehealth: Payer: Self-pay | Admitting: Gastroenterology

## 2018-08-18 ENCOUNTER — Encounter: Payer: Self-pay | Admitting: Gastroenterology

## 2018-08-18 LAB — TISSUE TRANSGLUTAMINASE, IGA: (tTG) Ab, IgA: 2 U/mL

## 2018-08-18 NOTE — Telephone Encounter (Signed)
Letter was faxed per request of pt to his employer for yesterday's appt.  The pt has been advised of this information.

## 2018-08-18 NOTE — Telephone Encounter (Signed)
Pt had an appt for x-ray yesterday at 3:00pm and also labs. He is requesting a letter for his work. Pls fax letter at [phone].

## 2018-08-21 ENCOUNTER — Other Ambulatory Visit: Payer: Self-pay

## 2018-08-21 ENCOUNTER — Telehealth: Payer: Self-pay

## 2018-08-21 ENCOUNTER — Ambulatory Visit (INDEPENDENT_AMBULATORY_CARE_PROVIDER_SITE_OTHER)
Admission: RE | Admit: 2018-08-21 | Discharge: 2018-08-21 | Disposition: A | Discharge status: Home / Self Care | Payer: BC Managed Care – PPO | Source: Ambulatory Visit | Attending: Gastroenterology | Admitting: Gastroenterology

## 2018-08-21 ENCOUNTER — Inpatient Hospital Stay: Admission: RE | Admit: 2018-08-21 | Payer: BC Managed Care – PPO | Source: Ambulatory Visit

## 2018-08-21 DIAGNOSIS — R109 Unspecified abdominal pain: Secondary | ICD-10-CM

## 2018-08-21 MED ORDER — IOHEXOL 300 MG/ML  SOLN
100.0000 mL | Freq: Once | INTRAMUSCULAR | Status: AC | PRN
  Administered 2018-08-21: 100 mL via INTRAVENOUS

## 2018-08-21 NOTE — Telephone Encounter (Signed)
-----   Message from Mauri Pole, MD sent at 08/21/2018  3:36 PM EDT ----- No acute abnormality on CT.  Sigmoid diverticulosis but no evidence to suggest diverticulitis.  Please inform patient the results. Thanks ----- Message ----- From: Timothy Lasso, RN Sent: 08/21/2018   2:05 PM EDT To: Mauri Pole, MD  Dr Silverio Decamp can you please review the CT scan for Dr Rush Landmark ?  I just looked and the report is not available yet but should be this afternoon.  Thanks  ----- Message ----- From: Timothy Lasso, RN Sent: 08/21/2018   8:40 AM EDT To: Timothy Lasso, RN   ----- Message ----- From: Timothy Lasso, RN Sent: 08/21/2018 To: Timothy Lasso, RN  [name],  I have released the results of CXR and KUB to patient via [facility].  Please move forward with the CT as planned.  Will you review that with one of the Docs of the Day since I'll be away when it returns?  Thanks.  GM

## 2018-08-21 NOTE — Telephone Encounter (Signed)
The pt has been advised and will wait for Dr Jerilynn Mages to review and make andy further recommendations.

## 2018-08-27 ENCOUNTER — Telehealth: Payer: Self-pay | Admitting: Gastroenterology

## 2018-08-27 NOTE — Telephone Encounter (Signed)
The pt was advised he had no acute process on the CT.  He states he has not had a BM yet (see 08/26/18 note)  He will continue through the steps given to help get his bowels moving and will call if no response.

## 2018-08-27 NOTE — Telephone Encounter (Signed)
Pls call pt, he has some questions regarding Ct scan results.

## 2018-09-01 NOTE — Telephone Encounter (Signed)
Dr Rush Landmark can you please advise

## 2018-09-05 DIAGNOSIS — R748 Abnormal levels of other serum enzymes: Secondary | ICD-10-CM

## 2018-09-05 HISTORY — DX: Abnormal levels of other serum enzymes: R74.8

## 2018-09-07 ENCOUNTER — Encounter (HOSPITAL_COMMUNITY): Payer: Self-pay | Admitting: Emergency Medicine

## 2018-09-07 ENCOUNTER — Other Ambulatory Visit: Payer: Self-pay

## 2018-09-07 ENCOUNTER — Telehealth: Payer: Self-pay | Admitting: Gastroenterology

## 2018-09-07 ENCOUNTER — Emergency Department (HOSPITAL_COMMUNITY)
Admission: EM | Admit: 2018-09-07 | Discharge: 2018-09-07 | Disposition: A | Discharge status: Home / Self Care | Payer: BC Managed Care – PPO | Attending: Emergency Medicine | Admitting: Emergency Medicine

## 2018-09-07 ENCOUNTER — Other Ambulatory Visit: Payer: BC Managed Care – PPO

## 2018-09-07 DIAGNOSIS — Z79899 Other long term (current) drug therapy: Secondary | ICD-10-CM | POA: Insufficient documentation

## 2018-09-07 DIAGNOSIS — R51 Headache: Secondary | ICD-10-CM | POA: Diagnosis not present

## 2018-09-07 DIAGNOSIS — Z7982 Long term (current) use of aspirin: Secondary | ICD-10-CM | POA: Insufficient documentation

## 2018-09-07 DIAGNOSIS — J020 Streptococcal pharyngitis: Secondary | ICD-10-CM | POA: Insufficient documentation

## 2018-09-07 DIAGNOSIS — J040 Acute laryngitis: Secondary | ICD-10-CM

## 2018-09-07 DIAGNOSIS — F17210 Nicotine dependence, cigarettes, uncomplicated: Secondary | ICD-10-CM | POA: Insufficient documentation

## 2018-09-07 DIAGNOSIS — K529 Noninfective gastroenteritis and colitis, unspecified: Secondary | ICD-10-CM

## 2018-09-07 DIAGNOSIS — J029 Acute pharyngitis, unspecified: Secondary | ICD-10-CM | POA: Diagnosis present

## 2018-09-07 DIAGNOSIS — K2100 Gastro-esophageal reflux disease with esophagitis, without bleeding: Secondary | ICD-10-CM

## 2018-09-07 DIAGNOSIS — I10 Essential (primary) hypertension: Secondary | ICD-10-CM | POA: Insufficient documentation

## 2018-09-07 MED ORDER — OMEPRAZOLE 40 MG PO CPDR: DELAYED_RELEASE_CAPSULE | ORAL | 2 refills | Status: DC

## 2018-09-07 NOTE — Telephone Encounter (Signed)
Pt requested more information on stool sample.

## 2018-09-07 NOTE — ED Triage Notes (Signed)
Pt reports sore throat x 1 week. Pt reports being diagnosed with strep throat, tx with clindamycin, still taking. Pt reports losing his voice, headache, generalized body aches, and loss of sense of taste/smell. pt reports fever on Tuesday night no fever since. Pt reports he was swabbed Friday for COVID with neg. result. Pt reports no relief since last Tuesday.

## 2018-09-07 NOTE — ED Provider Notes (Signed)
[facility] EMERGENCY DEPARTMENT Provider Note   CSN: [id number] Arrival date & time: 09/07/18  1340     History   Chief Complaint Chief Complaint  Patient presents with  . Sore Throat    HPI Colton Walker is a 43 y.o. male who presents with a sore throat. PMH significant for GERD, HTN, HLD. He states that he has not felt good for one week. Last Sunday he started to have a headache, sore throat, and loss of taste with no appetite, and he's lost his voice. He states every time he gets strep he loses his voice. He reports a fever on Tuesday but none since then. He went to his doctor's office on Tuesday and was formally diagnosed with strep throat and due to a PCN allergy was put on Clindamycin. He was also given a dose of a steroid and doesn't feel like that helped. He reports some nausea without vomiting. Since taking the Clindamycin he started to have diarrhea and he has submitted a stool sample to his doctor already. He also has had a negative COVID swab. He has been resting but still feels fatigued and not much better so he decided to come to the ED for a "second opinion". He also notes that he has chronic abdominal pain and has already had a CT of his abdomen 2 weeks ago and a recent colonoscopy. He also has medicine for nausea. He also had recent blood work on Friday and was told that it was normal.  HPI  Past Medical History:  Diagnosis Date  . Allergy   . Arthritis   . GERD (gastroesophageal reflux disease)   . HTN (hypertension)   . Hyperlipidemia   . OSA (obstructive sleep apnea)    uses cpap  . Sleep apnea     Patient Active Problem List   Diagnosis Date Noted  . Chronic diarrhea 07/28/2018  . Change in bowel habits 05/28/2018  . Pyrosis 04/17/2018  . Dysphagia 04/17/2018  . Gastroesophageal reflux disease 04/17/2018    Past Surgical History:  Procedure Laterality Date  . ESOPHAGEAL MANOMETRY N/A 07/20/2018   Procedure: ESOPHAGEAL MANOMETRY  (EM);  Surgeon: Irving Copas., MD;  Location: [facility] ENDOSCOPY;  Service: Gastroenterology;  Laterality: N/A;  . [place] IMPEDANCE STUDY  07/20/2018   Procedure: [place] IMPEDANCE STUDY;  Surgeon: Rush Landmark Telford Nab., MD;  Location: [facility];  Service: Gastroenterology;;  . TIBIA FRACTURE SURGERY Right         Home Medications    Prior to Admission medications   Medication Sig Start Date End Date Taking? Authorizing Provider  aspirin EC 81 MG tablet Take 81 mg by mouth daily.    [provider]  Cyanocobalamin (B-12) 1000 MCG TABS Take 1 tablet by mouth daily.    [provider]  esomeprazole (NEXIUM) 40 MG capsule Take 1 capsule (40 mg total) by mouth 2 (two) times daily before a meal. 07/28/18   Mansouraty, Telford Nab., MD  lisinopril (PRINIVIL,ZESTRIL) 10 MG tablet Take 10 mg by mouth daily.    [provider]  multivitamin (ONE-A-DAY MEN'S) TABS tablet Take 1 tablet by mouth daily.    [provider]  omeprazole (PRILOSEC) 40 MG capsule TAKE 1 CAPSULE(40 MG) BY MOUTH TWICE DAILY 07/22/18   Mansouraty, Telford Nab., MD  rosuvastatin (CRESTOR) 10 MG tablet Take 10 mg by mouth daily.    [provider]    Family History Family History  Problem Relation Age of Onset  . Hypertension  Mother   . Heart disease Father   . Hypertension Father   . Colon cancer Neg Hx   . Esophageal cancer Neg Hx   . Stomach cancer Neg Hx   . Rectal cancer Neg Hx   . Inflammatory bowel disease Neg Hx   . Liver disease Neg Hx   . Pancreatic cancer Neg Hx     Social History Social History   Tobacco Use  . Smoking status: Current Every Day Smoker    Packs/day: 0.50    Types: Cigarettes  . Smokeless tobacco: Never Used  Substance Use Topics  . Alcohol use: Not Currently    Frequency: Never  . Drug use: Never     Allergies   Lortab [hydrocodone-acetaminophen], Morphine, and Penicillins   Review of Systems Review of Systems  Constitutional:  Positive for appetite change, fatigue and fever (resolved).  HENT: Positive for sore throat and voice change. Negative for congestion, ear pain, rhinorrhea and trouble swallowing.   Respiratory: Negative for cough and shortness of breath.   Cardiovascular: Negative for chest pain.  Gastrointestinal: Positive for abdominal pain (chronic), constipation, diarrhea and nausea. Negative for vomiting.     Physical Exam Updated Vital Signs BP (!) 146/99 (BP Location: Right Arm)   Pulse 61   Temp 98 F (36.7 C) (Oral)   Resp 16   SpO2 98%   Physical Exam Vitals signs and nursing note reviewed.  Constitutional:      General: He is not in acute distress.    Appearance: Normal appearance. He is well-developed.  HENT:     Head: Normocephalic and atraumatic.     Right Ear: Tympanic membrane normal.     Left Ear: Tympanic membrane normal.     Nose: Nose normal.     Mouth/Throat:     Lips: Pink.     Mouth: Mucous membranes are moist.     Pharynx: Oropharynx is clear.  Eyes:     General: No scleral icterus.       Right eye: No discharge.        Left eye: No discharge.     Conjunctiva/sclera: Conjunctivae normal.     Pupils: Pupils are equal, round, and reactive to light.  Neck:     Musculoskeletal: Normal range of motion.  Cardiovascular:     Rate and Rhythm: Normal rate and regular rhythm.  Pulmonary:     Effort: Pulmonary effort is normal. No respiratory distress.     Breath sounds: Normal breath sounds.  Abdominal:     General: There is no distension.     Palpations: Abdomen is soft.     Tenderness: There is abdominal tenderness (left sided tenderness).  Skin:    General: Skin is warm and dry.  Neurological:     Mental Status: He is alert and oriented to person, place, and time.  Psychiatric:        Behavior: Behavior normal.      ED Treatments / Results  Labs (all labs ordered are listed, but only abnormal results are displayed) Labs Reviewed - No data to display   EKG None  Radiology No results found.  Procedures Procedures (including critical care time)  Medications Ordered in ED Medications - No data to display   Initial Impression / Assessment and Plan / ED Course  I have reviewed the triage vital signs and the nursing notes.  Pertinent labs & imaging results that were available during my care of the patient were reviewed by me and  considered in my medical decision making (see chart for details).  43 year old male with recent diagnosis of strep throat presents with persistent symptoms and not feeling well.  His vital signs are normal here.  Exam is overall unremarkable.  He does not have any evidence of peritonsillar abscess or deep space infection.  He is already on symptomatic medicines and has been treated with a dose of steroids without significant relief.  He had recent blood work as well which reportedly was normal.  Patient states that he is "OCD" about his health and was just worried that he was not feeling better.  I do not think there is much else we can do for him here other than symptomatic treatment.  He was offered fluids and symptomatic medicines and he declines.  He was advised to follow-up with his doctor and return if worsening.  Final Clinical Impressions(s) / ED Diagnoses   Final diagnoses:  Strep pharyngitis    ED Discharge Orders    None       Recardo Evangelist, PA-C 09/07/18 [place], DO 09/07/18 1529

## 2018-09-07 NOTE — Telephone Encounter (Signed)
The pt has questions regarding stool sample collection.  I have provided the pt with the number to the lab.  The lab will be better able to answer the collection questions

## 2018-09-16 LAB — STOOL CULTURE
MICRO NUMBER:: 731004
MICRO NUMBER:: 731005
MICRO NUMBER:: 731007
SHIGA RESULT:: NOT DETECTED
SPECIMEN QUALITY:: ADEQUATE
SPECIMEN QUALITY:: ADEQUATE
SPECIMEN QUALITY:: ADEQUATE

## 2018-09-16 LAB — OVA AND PARASITE EXAMINATION
CONCENTRATE RESULT:: NONE SEEN
MICRO NUMBER:: 731006
SPECIMEN QUALITY:: ADEQUATE
TRICHROME RESULT:: NONE SEEN

## 2018-09-16 LAB — FECAL LACTOFERRIN, QUANT
Fecal Lactoferrin: NEGATIVE
MICRO NUMBER:: 730166
SPECIMEN QUALITY:: ADEQUATE

## 2018-09-16 LAB — PANCREATIC ELASTASE, FECAL: Pancreatic Elastase-1, Stool: >500 mcg/g

## 2018-09-16 LAB — FECAL FAT, QUANTITATIVE: Specimen Total Weight: 52 g

## 2018-09-16 LAB — CLOSTRIDIUM DIFFICILE TOXIN B, QUALITATIVE, REAL-TIME PCR: Toxigenic C. Difficile by PCR: NOT DETECTED

## 2018-09-24 ENCOUNTER — Encounter: Payer: Self-pay | Admitting: Gastroenterology

## 2018-09-24 ENCOUNTER — Telehealth: Payer: Self-pay | Admitting: Gastroenterology

## 2018-09-24 ENCOUNTER — Ambulatory Visit: Payer: BC Managed Care – PPO | Admitting: Gastroenterology

## 2018-09-24 VITALS — BP 112/80 | HR 60 | Temp 98.4°F | Ht 74.75 in | Wt 221.2 lb

## 2018-09-24 DIAGNOSIS — K219 Gastro-esophageal reflux disease without esophagitis: Secondary | ICD-10-CM

## 2018-09-24 DIAGNOSIS — R1030 Lower abdominal pain, unspecified: Secondary | ICD-10-CM | POA: Diagnosis not present

## 2018-09-24 DIAGNOSIS — K529 Noninfective gastroenteritis and colitis, unspecified: Secondary | ICD-10-CM

## 2018-09-24 DIAGNOSIS — R12 Heartburn: Secondary | ICD-10-CM

## 2018-09-24 DIAGNOSIS — K4091 Unilateral inguinal hernia, without obstruction or gangrene, recurrent: Secondary | ICD-10-CM

## 2018-09-24 NOTE — Patient Instructions (Signed)
Start Fibercon 1-2 tablets daily.     If you are age 42 or younger, your body mass index should be between 19-25. Your Body mass index is 27.84 kg/m. If this is out of the aformentioned range listed, please consider follow up with your Primary Care Provider.    TIF( transoral inicisonless Fundoplication ) - appt with Dr. Bryan Lemma  We have sent a referral to [facility]. Their office will contact you.    Your provider has requested that you go to the basement level for lab work at your follow- up with Dr Bryan Lemma. Press "B" on the elevator. The lab is located at the first door on the left as you exit the elevator.  Thank you for choosing me and [facility].  Dr. Rush Landmark

## 2018-09-25 ENCOUNTER — Encounter: Payer: Self-pay | Admitting: Gastroenterology

## 2018-09-25 NOTE — Telephone Encounter (Signed)
Work note faxed to [phone].

## 2018-09-25 NOTE — Telephone Encounter (Signed)
Patient calling back regarding this. Patient requesting callback when this has been faxed. [phone]

## 2018-09-26 DIAGNOSIS — R1030 Lower abdominal pain, unspecified: Secondary | ICD-10-CM | POA: Insufficient documentation

## 2018-09-26 DIAGNOSIS — K4091 Unilateral inguinal hernia, without obstruction or gangrene, recurrent: Secondary | ICD-10-CM | POA: Insufficient documentation

## 2018-09-26 NOTE — Progress Notes (Signed)
GASTROENTEROLOGY OUTPATIENT CLINIC VISIT   Primary Care Provider Walker, Colton Anderson, MD No address on file [phone]  Patient Profile: Colton Walker is a 43 y.o. male with a pmh significant for hx of hypertension, hyperlipidemia, sleep apnea (on CPAP), seasonal allergies, osteoarthritis, prior TIA (on aspirin), longstanding GERD, change in bowel habits, inguinal hernia.  The patient presents to the [facility] for an evaluation and management of problem(s) noted below:  Problem List 1. Gastroesophageal reflux disease without esophagitis   2. Pyrosis   3. Unilateral recurrent inguinal hernia without obstruction or gangrene   4. Lower abdominal pain   5. Chronic diarrhea     History of Present Illness: Please see initial consultation note and progress notes for full details of HPI.    Interval History Today's visit was with the patient and his wife.  The patient after his colonoscopy dealt with issues of increased lower abdominal discomfort and pain to the point where he actually had not had a bowel movement for up to 10 days.  CT scan and x-rays and labs were performed as outlined below.  There was concern for possible prior colitis but no active colitis or diverticulitis was found.  No evidence of perforation or other symptoms.  After time the patient finally had return of his bowel movements and is now back to having issues of loose bowel movements frequently.  He is having anywhere between 2-4 bowel movements in the early morning before he even has to work.  Bowel movements are loose and watery at times.  His GERD symptoms have persisted even on high-dose twice daily PPI therapy.  His dysphagia symptoms continue to persist even after empiric dilation.  Patient has had increased abdominal discomfort in his left lower quadrant/left groin region.  On CT scan there was suggestion of a inguinal hernia.  When he lifts heavy objects at work, which is common, he has  significant discomfort.  They wonder if that needs further work-up or evaluation.  He denies any nausea or vomiting.  The blood in his stools has persisted but not to the extent of what it was previously and this is mostly blood when he wipes.  GI Review of Systems Positive as above including bloating at times Negative for nausea, globus, odynophagia, odynophagia, melena  Review of Systems General: Denies fevers/chills/weight loss Cardiovascular: Denies chest pain Pulmonary: Denies shortness of breath Gastroenterological: See HPI  Genitourinary: Denies darkened urine Hematological: Denies easy bruising Dermatological: Denies jaundice Psychological: Mood is stable    Medications Current Outpatient Medications  Medication Sig Dispense Refill  . aspirin EC 81 MG tablet Take 81 mg by mouth daily.    . Cyanocobalamin (B-12) 1000 MCG TABS Take 1 tablet by mouth daily.    Marland Kitchen lisinopril (PRINIVIL,ZESTRIL) 10 MG tablet Take 10 mg by mouth daily.    . multivitamin (ONE-A-DAY MEN'S) TABS tablet Take 1 tablet by mouth daily.    Marland Kitchen omeprazole (PRILOSEC) 40 MG capsule TAKE 1 CAPSULE(40 MG) BY MOUTH TWICE DAILY 60 capsule 2  . PROCTOZONE-HC 2.5 % rectal cream Apply 1 application topically 2 (two) times daily.    . propranolol (INDERAL) 20 MG tablet Take 1 tablet by mouth 2 (two) times daily.    . rosuvastatin (CRESTOR) 10 MG tablet Take 10 mg by mouth daily.    Marland Kitchen zolmitriptan (ZOMIG) 5 MG tablet Take 1 tablet by mouth as needed.     No current facility-administered medications for this visit.     Allergies Allergies  Allergen Reactions  . Lortab [Hydrocodone-Acetaminophen]   . Morphine Nausea Only  . Penicillins     Histories Past Medical History:  Diagnosis Date  . Allergy   . Arthritis   . Colon polyp, hyperplastic   . Diverticulosis   . GERD (gastroesophageal reflux disease)   . HTN (hypertension)   . Hyperlipidemia   . Inguinal hernia    left  . OSA (obstructive sleep apnea)     uses cpap  . Sleep apnea    Past Surgical History:  Procedure Laterality Date  . ESOPHAGEAL MANOMETRY N/A 07/20/2018   Procedure: ESOPHAGEAL MANOMETRY (EM);  Surgeon: Irving Copas., MD;  Location: [facility];  Service: Gastroenterology;  Laterality: N/A;  . [place] IMPEDANCE STUDY  07/20/2018   Procedure: [place] IMPEDANCE STUDY;  Surgeon: Rush Landmark Telford Nab., MD;  Location: [facility];  Service: Gastroenterology;;  . TIBIA FRACTURE SURGERY Right    Social History   Socioeconomic History  . Marital status: Married    Spouse name: Not on file  . Number of children: Not on file  . Years of education: Not on file  . Highest education level: Not on file  Occupational History  . Not on file  Social Needs  . Financial resource strain: Not on file  . Food insecurity    Worry: Not on file    Inability: Not on file  . Transportation needs    Medical: Not on file    Non-medical: Not on file  Tobacco Use  . Smoking status: Current Every Day Smoker    Packs/day: 0.50    Types: Cigarettes  . Smokeless tobacco: Never Used  Substance and Sexual Activity  . Alcohol use: Not Currently    Frequency: Never  . Drug use: Never  . Sexual activity: Not on file  Lifestyle  . Physical activity    Days per week: Not on file    Minutes per session: Not on file  . Stress: Not on file  Relationships  . Social Herbalist on phone: Not on file    Gets together: Not on file    Attends religious service: Not on file    Active member of club or organization: Not on file    Attends meetings of clubs or organizations: Not on file    Relationship status: Not on file  . Intimate partner violence    Fear of current or ex partner: Not on file    Emotionally abused: Not on file    Physically abused: Not on file    Forced sexual activity: Not on file  Other Topics Concern  . Not on file  Social History Narrative  . Not on file   Family History  Problem Relation Age of Onset   . Hypertension Mother   . Heart disease Father   . Hypertension Father   . Colon cancer Neg Hx   . Esophageal cancer Neg Hx   . Stomach cancer Neg Hx   . Rectal cancer Neg Hx   . Inflammatory bowel disease Neg Hx   . Liver disease Neg Hx   . Pancreatic cancer Neg Hx    I have reviewed his medical, social, and family history in detail and updated the electronic medical record as necessary.    PHYSICAL EXAMINATION  BP 112/80 (BP Location: Left Arm, Patient Position: Sitting, Cuff Size: Normal)   Pulse 60   Temp 98.4 F (36.9 C)   Ht 6' 2.75" (1.899 m) Comment: height  measured without shoes  Wt 221 lb 4 oz (100.4 kg)   BMI 27.84 kg/m  GEN: NAD, appears stated age, doesn't appear chronically ill, accompanied by wife PSYCH: Cooperative, without pressured speech EYE: Conjunctivae pink, sclerae anicteric ENT: MMM CV: RR without R/Gs  RESP: CTAB posteriorly, without wheezing GI: NABS, soft, mild tenderness to palpation in the lower quadrants bilaterally, without rebound or guarding, no HSM appreciated GU: Left inguinal likely direct hernia palpated with significant pain upon patient evaluation and cough, no evidence of a right inguinal hernia palpated MSK/EXT: No lower extremity edema SKIN: No jaundice NEURO:  Alert & Oriented x 3, no focal deficits   REVIEW OF DATA  I reviewed the following data at the time of this encounter:  GI Procedures and Studies  June 2020 pH study off PPI   June 2020 esophageal manometry   Laboratory Studies  Reviewed outside records  Imaging Studies  July 2020 CT abdomen pelvis with contrast FINDINGS: Lower chest: Normal heart size. Lung bases are clear. No pleural effusion. Hepatobiliary: The liver is normal in size and contour. No focal hepatic lesion is identified. Gallbladder is unremarkable. No intrahepatic or extrahepatic biliary ductal dilatation. Pancreas: Unremarkable Spleen: Unremarkable Adrenals/Urinary Tract: Adrenal glands  are normal. Kidneys enhance symmetrically with contrast. No hydronephrosis. Urinary bladder is unremarkable. Stomach/Bowel: Normal morphology of the stomach. No evidence for small bowel obstruction. No free fluid or free intraperitoneal air. Normal appendix. There is mild wall thickening descending and sigmoid colon without surrounding fat stranding suggestive of sequelae of prior episodes of colitis. Sigmoid colonic diverticulosis without CT evidence for acute diverticulitis. Vascular/Lymphatic: Normal caliber abdominal aorta. Retroaortic left renal vein. No retroperitoneal lymphadenopathy. Reproductive: Heterogeneous prostate. Other: Moderate sized fat containing left inguinal hernia. Musculoskeletal: Lumbar spine degenerative changes. No aggressive or acute appearing osseous lesions. IMPRESSION: No acute process within the abdomen or pelvis.   ASSESSMENT  Mr. Vivier is a 43 y.o. male with a pmh significant for hx of hypertension, hyperlipidemia, sleep apnea (on CPAP), seasonal allergies, osteoarthritis, prior TIA (on aspirin), longstanding GERD, change in bowel habits, inguinal hernia.  The patient is seen today for evaluation and management of:  1. Gastroesophageal reflux disease without esophagitis   2. Pyrosis   3. Unilateral recurrent inguinal hernia without obstruction or gangrene   4. Lower abdominal pain   5. Chronic diarrhea    Three major issues to deal with today but patient overall is hemodynamically stable.  In regards to the patient's longstanding GERD symptoms, as had previously noted and we have previously discussed, I think the patient will benefit from consideration of either surgical management versus endoscopic management for his chronic GERD symptoms.  He has been on multiple PPI therapies and continues to have symptoms of significant GERD.  Thankfully, he has not developed Barrett's esophagus or esophagitis with this.  His pH impedance study confirms his acid  reflux.  I would like the patient to consider fundoplication endoscopically or surgically.  I will schedule him a clinic visit with my partner Dr. Bryan Lemma who specializes in transoral incision less fundoplication to consider whether the patient may be a candidate and if insurance will allow [place] to move forward with that.  The patient understands the potential risks and benefits of endoscopic or surgical therapy but would be happy to have a consultation to decide whether something would want to be performed or not.  In regards to the patient's abdominal discomfort and inguinal hernia, he has significant pain upon palpation of this fat  containing hernia and I think that he will require a surgical evaluation for possible management of this.  I am placing a referral to [place] [facility] to discuss and consider surgical management options.  In regards to his chronic bowel habit changes and diarrhea, I actually wonder if the patient has issues of constipation with overflow, although I have not been able to confirm that based on imaging.  His 10 days of constipation after his colonoscopy is extremely abnormal for someone to have issues of chronic diarrhea but he is once again have loose bowel movements occur.  We ruled out multiple etiologies including microscopic/lymphocytic colitis as well as inflammatory bowel disease.  He may well have a functional bowel syndrome in regards to diarrhea or constipation.  We will see how he does and I would like to start by bulking up the stools and he has not initiated fiber yet so we will initiate fiber.  After that we can consider if there will be other therapies or other modes of trying to optimize and push things through his GI tract via the use of laxative therapy versus considering the use of antimotility agents.  More to come based on how he does.   PLAN  Continue omeprazole 40 mg twice daily Continue Pepcid 40 mg nightly Referral to Dr. Bryan Lemma for consideration of  TIF Referral to [place] [facility] to discuss inguinal hernia and repair Initiate fiber 1-2 times daily (FiberCon versus other fiber supplements being okay) Consideration of antimotility versus promotility agents based on how he responds to fiber Hepatic function panel to be performed as he relates recent liver test being abnormal when checked by his primary care provider (previously with hours they were normal)   Orders Placed This Encounter  Procedures  . Hepatic function panel  . Ambulatory referral to General Surgery    New Prescriptions   No medications on file   Modified Medications   No medications on file    Planned Follow Up: No follow-ups on file.   Justice Britain, MD [facility] # [phone]

## 2018-10-13 ENCOUNTER — Telehealth: Payer: Self-pay | Admitting: Gastroenterology

## 2018-10-13 NOTE — Telephone Encounter (Signed)
No answer no voice mail.  Will send in a message via My Chart

## 2018-10-16 ENCOUNTER — Ambulatory Visit: Payer: Self-pay | Admitting: Surgery

## 2018-10-16 NOTE — H&P (View-Only) (Signed)
Surgical H&P  HPI: 43yo man with history of OSA on CPAP, HTN, hyperlipidemia, longstanding GERD (demeester 49, normal manometry, normal EGD, associated dysphagia), diverticulosis, arthritis, prior TIA, and tobacco abuse (0.5packs per day). He sees Dr. Rush Landmark for multiple GI issues including GERD/dysphagia (referred for TIF), abnormal bowel movements and abdominal pain. He noted worsening pain in the left groin for the last 3 or 4 months. 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 It is better when he is lying down and resting. He also notes discomfort with urination. He denies any prior abdominal surgery. He does smoke about half pack of cigarettes a day but is planning to quit, and has are cut back from 3 packs a day.  Allergies  Allergen Reactions  . Lortab [Hydrocodone-Acetaminophen]   . Morphine Nausea Only  . Penicillins     Past Medical History:  Diagnosis Date  . Allergy   . Arthritis   . Colon polyp, hyperplastic   . Diverticulosis   . GERD (gastroesophageal reflux disease)   . HTN (hypertension)   . Hyperlipidemia   . Inguinal hernia    left  . OSA (obstructive sleep apnea)    uses cpap  . Sleep apnea     Past Surgical History:  Procedure Laterality Date  . ESOPHAGEAL MANOMETRY N/A 07/20/2018   Procedure: ESOPHAGEAL MANOMETRY (EM);  Surgeon: Irving Copas., MD;  Location: [facility];  Service: Gastroenterology;  Laterality: N/A;  . [place] IMPEDANCE STUDY  07/20/2018   Procedure: [place] IMPEDANCE STUDY;  Surgeon: Rush Landmark Telford Nab., MD;  Location: [facility];  Service: Gastroenterology;;  . TIBIA FRACTURE SURGERY Right     Family History  Problem Relation Age of Onset  . Hypertension Mother   . Heart disease Father   . Hypertension Father   . Colon cancer Neg Hx   .  Esophageal cancer Neg Hx   . Stomach cancer Neg Hx   . Rectal cancer Neg Hx   . Inflammatory bowel disease Neg Hx   . Liver disease Neg Hx   . Pancreatic cancer Neg Hx     Social History   Socioeconomic History  . Marital status: Married    Spouse name: Not on file  . Number of children: Not on file  . Years of education: Not on file  . Highest education level: Not on file  Occupational History  . Not on file  Social Needs  . Financial resource strain: Not on file  . Food insecurity    Worry: Not on file    Inability: Not on file  . Transportation needs    Medical: Not on file    Non-medical: Not on file  Tobacco Use  . Smoking status: Current Every Day Smoker    Packs/day: 0.50    Types: Cigarettes  . Smokeless tobacco: Never Used  Substance and Sexual Activity  . Alcohol use: Not Currently    Frequency: Never  . Drug use: Never  . Sexual activity: Not on file  Lifestyle  . Physical activity    Days per week: Not on file    Minutes per session: Not on file  . Stress: Not on file  Relationships  . Social Herbalist on phone: Not on file    Gets together: Not on file  Attends religious service: Not on file    Active member of club or organization: Not on file    Attends meetings of clubs or organizations: Not on file    Relationship status: Not on file  Other Topics Concern  . Not on file  Social History Narrative  . Not on file    Current Outpatient Medications on File Prior to Visit  Medication Sig Dispense Refill  . aspirin EC 81 MG tablet Take 81 mg by mouth daily.    . Cyanocobalamin (B-12) 1000 MCG TABS Take 1 tablet by mouth daily.    Marland Kitchen lisinopril (PRINIVIL,ZESTRIL) 10 MG tablet Take 10 mg by mouth daily.    . multivitamin (ONE-A-DAY MEN'S) TABS tablet Take 1 tablet by mouth daily.    Marland Kitchen omeprazole (PRILOSEC) 40 MG capsule TAKE 1 CAPSULE(40 MG) BY MOUTH TWICE DAILY 60 capsule 2  . PROCTOZONE-HC 2.5 % rectal cream Apply 1 application  topically 2 (two) times daily.    . propranolol (INDERAL) 20 MG tablet Take 1 tablet by mouth 2 (two) times daily.    . rosuvastatin (CRESTOR) 10 MG tablet Take 10 mg by mouth daily.    Marland Kitchen zolmitriptan (ZOMIG) 5 MG tablet Take 1 tablet by mouth as needed.     No current facility-administered medications on file prior to visit.     Review of Systems: General Not Present- Appetite Loss, Chills, Fatigue, Fever, Night Sweats, Weight Gain and Weight Loss. Skin Not Present- Change in Wart/Mole, Dryness, Hives, Jaundice, New Lesions, Non-Healing Wounds, Rash and Ulcer. HEENT Not Present- Earache, Hearing Loss, Hoarseness, Nose Bleed, Oral Ulcers, Ringing in the Ears, Seasonal Allergies, Sinus Pain, Sore Throat, Visual Disturbances, Wears glasses/contact lenses and Yellow Eyes. Respiratory Not Present- Bloody sputum, Chronic Cough, Difficulty Breathing, Snoring and Wheezing. Breast Not Present- Breast Mass, Breast Pain, Nipple Discharge and Skin Changes. Cardiovascular Not Present- Chest Pain, Difficulty Breathing Lying Down, Leg Cramps, Palpitations, Rapid Heart Rate, Shortness of Breath and Swelling of Extremities. Gastrointestinal Present- Abdominal Pain and Hemorrhoids. Not Present- Bloating, Bloody Stool, Change in Bowel Habits, Chronic diarrhea, Constipation, Difficulty Swallowing, Excessive gas, Gets full quickly at meals, Indigestion, Nausea, Rectal Pain and Vomiting. Male Genitourinary Not Present- Blood in Urine, Change in Urinary Stream, Frequency, Impotence, Nocturia, Painful Urination, Urgency and Urine Leakage. All other systems negative  Physical Exam: Vitals Emeline Gins CMA; 10/16/2018 2:32 PM) 10/16/2018 2:31 PM Weight: 220.4 lb Height: 75in Body Surface Area: 2.29 m Body Mass Index: 27.55 kg/m  Temp.: 98.45F  Pulse: 70 (Regular)  BP: 120/74 (Sitting, Left Arm, Standard)  Gen: alert and well appearing Eye: extraocular motion intact, no scleral icterus ENT: moist  mucus membranes, dentition intact Neck: no mass or thyromegaly Chest: unlabored respirations, symmetrical air entry, clear bilaterally CV: regular rate and rhythm, no pedal edema Abdomen: soft, nontender, nondistended. No mass or organomegaly. There is exquisite tenderness in the left inguinal canal and along the spermatic cord. Laxity in the left groin, but hernia is easily palpable. No tenderness or hernia on the right side. He had a CT scan in July which demonstrates a possible fat-containing left inguinal hernia MSK: strength symmetrical throughout, no deformity Neuro: grossly intact, normal gait Psych: normal mood and affect, appropriate insight Skin: warm and dry, no rash or lesion on limited exam   CBC Latest Ref Rng & Units 08/17/2018 04/14/2018  WBC 4.0 - 10.5 K/uL 11.7(H) 11.1(H)  Hemoglobin 13.0 - 17.0 g/dL 15.0 15.1  Hematocrit 39.0 - 52.0 % 43.9 44.0  Platelets 150.0 - 400.0 K/uL 347.0 346.0    CMP Latest Ref Rng & Units 08/17/2018 04/14/2018  Glucose 70 - 99 mg/dL 100(H) 89  BUN 6 - 23 mg/dL 23 26(H)  Creatinine 0.40 - 1.50 mg/dL 1.11 0.95  Sodium 135 - 145 mEq/L 138 138  Potassium 3.5 - 5.1 mEq/L 4.1 4.0  Chloride 96 - 112 mEq/L 103 102  CO2 19 - 32 mEq/L 22 24  Calcium 8.4 - 10.5 mg/dL 10.3 10.3  Total Protein 6.0 - 8.3 g/dL 8.4(H) -  Total Bilirubin 0.2 - 1.2 mg/dL 0.6 -  Alkaline Phos 39 - 117 U/L 84 -  AST 0 - 37 U/L 24 -  ALT 0 - 53 U/L 34 -    No results found for: INR, PROTIME  Imaging: No results found.   A/P: LEFT INGUINAL HERNIA (K40.90) Story: Symptomatic with extreme pain. Recommend proceeding with open repair for this unilateral non-recurrent hernia. Discussed the surgery in detail including risks of bleeding, infection, pain, scarring, injury to structures in the area including blood vessels, nerves, which may cause chronic pain, risk of hernia recurrence which is increased by his nicotine abuse, we also discussed that postoperatively but advised to  refrain from any heavy lifting or straining for 6 weeks. Questions were answered. We will schedule him as soon as possible.   Romana Juniper, MD [facility], [place] Pager [phone]

## 2018-10-16 NOTE — H&P (Signed)
Surgical H&P  HPI: 43yo man with history of OSA on CPAP, HTN, hyperlipidemia, longstanding GERD (demeester 49, normal manometry, normal EGD, associated dysphagia), diverticulosis, arthritis, prior TIA, and tobacco abuse (0.5packs per day). He sees Dr. Rush Landmark for multiple GI issues including GERD/dysphagia (referred for TIF), abnormal bowel movements and abdominal pain. He noted worsening pain in the left groin for the last 3 or 4 months. 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 It is better when he is lying down and resting. He also notes discomfort with urination. He denies any prior abdominal surgery. He does smoke about half pack of cigarettes a day but is planning to quit, and has are cut back from 3 packs a day.  Allergies  Allergen Reactions  . Lortab [Hydrocodone-Acetaminophen]   . Morphine Nausea Only  . Penicillins     Past Medical History:  Diagnosis Date  . Allergy   . Arthritis   . Colon polyp, hyperplastic   . Diverticulosis   . GERD (gastroesophageal reflux disease)   . HTN (hypertension)   . Hyperlipidemia   . Inguinal hernia    left  . OSA (obstructive sleep apnea)    uses cpap  . Sleep apnea     Past Surgical History:  Procedure Laterality Date  . ESOPHAGEAL MANOMETRY N/A 07/20/2018   Procedure: ESOPHAGEAL MANOMETRY (EM);  Surgeon: Irving Copas., MD;  Location: [facility];  Service: Gastroenterology;  Laterality: N/A;  . [place] IMPEDANCE STUDY  07/20/2018   Procedure: [place] IMPEDANCE STUDY;  Surgeon: Rush Landmark Telford Nab., MD;  Location: [facility];  Service: Gastroenterology;;  . TIBIA FRACTURE SURGERY Right     Family History  Problem Relation Age of Onset  . Hypertension Mother   . Heart disease Father   . Hypertension Father   . Colon cancer Neg Hx   .  Esophageal cancer Neg Hx   . Stomach cancer Neg Hx   . Rectal cancer Neg Hx   . Inflammatory bowel disease Neg Hx   . Liver disease Neg Hx   . Pancreatic cancer Neg Hx     Social History   Socioeconomic History  . Marital status: Married    Spouse name: Not on file  . Number of children: Not on file  . Years of education: Not on file  . Highest education level: Not on file  Occupational History  . Not on file  Social Needs  . Financial resource strain: Not on file  . Food insecurity    Worry: Not on file    Inability: Not on file  . Transportation needs    Medical: Not on file    Non-medical: Not on file  Tobacco Use  . Smoking status: Current Every Day Smoker    Packs/day: 0.50    Types: Cigarettes  . Smokeless tobacco: Never Used  Substance and Sexual Activity  . Alcohol use: Not Currently    Frequency: Never  . Drug use: Never  . Sexual activity: Not on file  Lifestyle  . Physical activity    Days per week: Not on file    Minutes per session: Not on file  . Stress: Not on file  Relationships  . Social Herbalist on phone: Not on file    Gets together: Not on file  Attends religious service: Not on file    Active member of club or organization: Not on file    Attends meetings of clubs or organizations: Not on file    Relationship status: Not on file  Other Topics Concern  . Not on file  Social History Narrative  . Not on file    Current Outpatient Medications on File Prior to Visit  Medication Sig Dispense Refill  . aspirin EC 81 MG tablet Take 81 mg by mouth daily.    . Cyanocobalamin (B-12) 1000 MCG TABS Take 1 tablet by mouth daily.    Marland Kitchen lisinopril (PRINIVIL,ZESTRIL) 10 MG tablet Take 10 mg by mouth daily.    . multivitamin (ONE-A-DAY MEN'S) TABS tablet Take 1 tablet by mouth daily.    Marland Kitchen omeprazole (PRILOSEC) 40 MG capsule TAKE 1 CAPSULE(40 MG) BY MOUTH TWICE DAILY 60 capsule 2  . PROCTOZONE-HC 2.5 % rectal cream Apply 1 application  topically 2 (two) times daily.    . propranolol (INDERAL) 20 MG tablet Take 1 tablet by mouth 2 (two) times daily.    . rosuvastatin (CRESTOR) 10 MG tablet Take 10 mg by mouth daily.    Marland Kitchen zolmitriptan (ZOMIG) 5 MG tablet Take 1 tablet by mouth as needed.     No current facility-administered medications on file prior to visit.     Review of Systems: General Not Present- Appetite Loss, Chills, Fatigue, Fever, Night Sweats, Weight Gain and Weight Loss. Skin Not Present- Change in Wart/Mole, Dryness, Hives, Jaundice, New Lesions, Non-Healing Wounds, Rash and Ulcer. HEENT Not Present- Earache, Hearing Loss, Hoarseness, Nose Bleed, Oral Ulcers, Ringing in the Ears, Seasonal Allergies, Sinus Pain, Sore Throat, Visual Disturbances, Wears glasses/contact lenses and Yellow Eyes. Respiratory Not Present- Bloody sputum, Chronic Cough, Difficulty Breathing, Snoring and Wheezing. Breast Not Present- Breast Mass, Breast Pain, Nipple Discharge and Skin Changes. Cardiovascular Not Present- Chest Pain, Difficulty Breathing Lying Down, Leg Cramps, Palpitations, Rapid Heart Rate, Shortness of Breath and Swelling of Extremities. Gastrointestinal Present- Abdominal Pain and Hemorrhoids. Not Present- Bloating, Bloody Stool, Change in Bowel Habits, Chronic diarrhea, Constipation, Difficulty Swallowing, Excessive gas, Gets full quickly at meals, Indigestion, Nausea, Rectal Pain and Vomiting. Male Genitourinary Not Present- Blood in Urine, Change in Urinary Stream, Frequency, Impotence, Nocturia, Painful Urination, Urgency and Urine Leakage. All other systems negative  Physical Exam: Vitals Emeline Gins CMA; 10/16/2018 2:32 PM) 10/16/2018 2:31 PM Weight: 220.4 lb Height: 75in Body Surface Area: 2.29 m Body Mass Index: 27.55 kg/m  Temp.: 98.75F  Pulse: 70 (Regular)  BP: 120/74 (Sitting, Left Arm, Standard)  Gen: alert and well appearing Eye: extraocular motion intact, no scleral icterus ENT: moist  mucus membranes, dentition intact Neck: no mass or thyromegaly Chest: unlabored respirations, symmetrical air entry, clear bilaterally CV: regular rate and rhythm, no pedal edema Abdomen: soft, nontender, nondistended. No mass or organomegaly. There is exquisite tenderness in the left inguinal canal and along the spermatic cord. Laxity in the left groin, but hernia is easily palpable. No tenderness or hernia on the right side. He had a CT scan in July which demonstrates a possible fat-containing left inguinal hernia MSK: strength symmetrical throughout, no deformity Neuro: grossly intact, normal gait Psych: normal mood and affect, appropriate insight Skin: warm and dry, no rash or lesion on limited exam   CBC Latest Ref Rng & Units 08/17/2018 04/14/2018  WBC 4.0 - 10.5 K/uL 11.7(H) 11.1(H)  Hemoglobin 13.0 - 17.0 g/dL 15.0 15.1  Hematocrit 39.0 - 52.0 % 43.9 44.0  Platelets 150.0 - 400.0 K/uL 347.0 346.0    CMP Latest Ref Rng & Units 08/17/2018 04/14/2018  Glucose 70 - 99 mg/dL 100(H) 89  BUN 6 - 23 mg/dL 23 26(H)  Creatinine 0.40 - 1.50 mg/dL 1.11 0.95  Sodium 135 - 145 mEq/L 138 138  Potassium 3.5 - 5.1 mEq/L 4.1 4.0  Chloride 96 - 112 mEq/L 103 102  CO2 19 - 32 mEq/L 22 24  Calcium 8.4 - 10.5 mg/dL 10.3 10.3  Total Protein 6.0 - 8.3 g/dL 8.4(H) -  Total Bilirubin 0.2 - 1.2 mg/dL 0.6 -  Alkaline Phos 39 - 117 U/L 84 -  AST 0 - 37 U/L 24 -  ALT 0 - 53 U/L 34 -    No results found for: INR, PROTIME  Imaging: No results found.   A/P: LEFT INGUINAL HERNIA (K40.90) Story: Symptomatic with extreme pain. Recommend proceeding with open repair for this unilateral non-recurrent hernia. Discussed the surgery in detail including risks of bleeding, infection, pain, scarring, injury to structures in the area including blood vessels, nerves, which may cause chronic pain, risk of hernia recurrence which is increased by his nicotine abuse, we also discussed that postoperatively but advised to  refrain from any heavy lifting or straining for 6 weeks. Questions were answered. We will schedule him as soon as possible.   Romana Juniper, MD [facility], [place] Pager [phone]

## 2018-10-19 ENCOUNTER — Other Ambulatory Visit (HOSPITAL_COMMUNITY): Payer: BC Managed Care – PPO

## 2018-10-20 ENCOUNTER — Encounter (HOSPITAL_COMMUNITY): Payer: Self-pay

## 2018-10-20 NOTE — Patient Instructions (Addendum)
DUE TO COVID-19 ONLY ONE VISITOR IS ALLOWED TO COME WITH YOU AND STAY IN THE WAITING ROOM ONLY DURING PRE OP AND PROCEDURE. THE ONE VISITOR MAY VISIT WITH YOU IN YOUR PRIVATE ROOM DURING VISITING HOURS ONLY!!   COVID SWAB TESTING MUST BE COMPLETED ON:  Thursday, Sept. 17, 2020 at 1:55PM at [facility] Location.  (Must self quarantine after testing. Follow instructions on handout.)             Your procedure is scheduled on: Monday., Sept. 21, 2020   Report to [facility] Main  Entrance    Report to admitting at 10:15 AM   Call this number if you have problems the morning of surgery [phone]   Bring CPAP mask and tubing day of surgery   Do not eat food or drink liquids :After Midnight.   Brush your teeth the morning of surgery.   Do NOT smoke after Midnight   Take these medicines the morning of surgery with A SIP OF WATER: Omeprazole, Propranolol                               You may not have any metal on your body including jewelry, and body piercings             Do not wear lotions, powders, perfumes/cologne, or deodorant                         Men may shave face and neck.   Do not bring valuables to the hospital. [place].   Contacts, dentures or bridgework may not be worn into surgery.    Patients discharged the day of surgery will not be allowed to drive home.   Special Instructions: Bring a copy of your healthcare power of attorney and living will documents         the day of surgery if you haven't scanned them in before.              Please read over the following fact sheets you were given:  [facility] - Preparing for Surgery Before surgery, you can play an important role.  Because skin is not sterile, your skin needs to be as free of germs as possible.  You can reduce the number of germs on your skin by washing with CHG (chlorahexidine gluconate) soap before surgery.  CHG is an antiseptic cleaner  which kills germs and bonds with the skin to continue killing germs even after washing. Please DO NOT use if you have an allergy to CHG or antibacterial soaps.  If your skin becomes reddened/irritated stop using the CHG and inform your nurse when you arrive at Short Stay. Do not shave (including legs and underarms) for at least 48 hours prior to the first CHG shower.  You may shave your face/neck.  Please follow these instructions carefully:  1.  Shower with CHG Soap the night before surgery and the  morning of surgery.  2.  If you choose to wash your hair, wash your hair first as usual with your normal  shampoo.  3.  After you shampoo, rinse your hair and body thoroughly to remove the shampoo.  4.  Use CHG as you would any other liquid soap.  You can apply chg directly to the skin and wash.  Gently with a scrungie or clean washcloth.  5.  Apply the CHG Soap to your body ONLY FROM THE NECK DOWN.   Do   not use on face/ open                           Wound or open sores. Avoid contact with eyes, ears mouth and   genitals (private parts).                       Wash face,  Genitals (private parts) with your normal soap.             6.  Wash thoroughly, paying special attention to the area where your    surgery  will be performed.  7.  Thoroughly rinse your body with warm water from the neck down.  8.  DO NOT shower/wash with your normal soap after using and rinsing off the CHG Soap.                9.  Pat yourself dry with a clean towel.            10.  Wear clean pajamas.            11.  Place clean sheets on your bed the night of your first shower and do not  sleep with pets. Day of Surgery : Do not apply any lotions/deodorants the morning of surgery.  Please wear clean clothes to the hospital/surgery center.  FAILURE TO FOLLOW THESE INSTRUCTIONS MAY RESULT IN THE CANCELLATION OF YOUR SURGERY  PATIENT SIGNATURE_________________________________  NURSE  SIGNATURE__________________________________  ________________________________________________________________________

## 2018-10-21 ENCOUNTER — Encounter (HOSPITAL_COMMUNITY): Payer: Self-pay

## 2018-10-21 ENCOUNTER — Encounter (HOSPITAL_COMMUNITY)
Admission: RE | Admit: 2018-10-21 | Discharge: 2018-10-21 | Disposition: A | Discharge status: Home / Self Care | Payer: BC Managed Care – PPO | Source: Ambulatory Visit | Attending: Surgery | Admitting: Surgery

## 2018-10-21 ENCOUNTER — Other Ambulatory Visit: Payer: Self-pay

## 2018-10-21 DIAGNOSIS — R001 Bradycardia, unspecified: Secondary | ICD-10-CM | POA: Diagnosis not present

## 2018-10-21 DIAGNOSIS — Z01810 Encounter for preprocedural cardiovascular examination: Secondary | ICD-10-CM | POA: Insufficient documentation

## 2018-10-21 DIAGNOSIS — Z01812 Encounter for preprocedural laboratory examination: Secondary | ICD-10-CM | POA: Diagnosis not present

## 2018-10-21 HISTORY — DX: Other amnesia: R41.3

## 2018-10-21 HISTORY — DX: Obstructive sleep apnea (adult) (pediatric): G47.33

## 2018-10-21 HISTORY — DX: Personal history of diseases of the blood and blood-forming organs and certain disorders involving the immune mechanism: Z86.2

## 2018-10-21 HISTORY — DX: Unspecified fracture of right wrist and hand, initial encounter for closed fracture: S62.91XA

## 2018-10-21 HISTORY — DX: Unspecified fracture of shaft of right tibia, initial encounter for closed fracture: S82.201A

## 2018-10-21 HISTORY — DX: Polyneuropathy, unspecified: G62.9

## 2018-10-21 HISTORY — DX: Other localized visual field defect, bilateral: H53.453

## 2018-10-21 HISTORY — DX: Migraine, unspecified, not intractable, without status migrainosus: G43.909

## 2018-10-21 HISTORY — DX: Other seasonal allergic rhinitis: J30.2

## 2018-10-21 HISTORY — DX: Personal history of other diseases of the digestive system: Z87.19

## 2018-10-21 HISTORY — DX: Unspecified osteoarthritis, unspecified site: M19.90

## 2018-10-21 HISTORY — DX: Personal history of urinary calculi: Z87.442

## 2018-10-21 HISTORY — DX: Carpal tunnel syndrome, bilateral upper limbs: G56.03

## 2018-10-21 HISTORY — DX: White matter disease, unspecified: R90.82

## 2018-10-21 HISTORY — DX: Transient cerebral ischemic attack, unspecified: G45.9

## 2018-10-21 LAB — BASIC METABOLIC PANEL
Anion gap: 7 (ref 5–15)
BUN: 23 mg/dL — ABNORMAL HIGH (ref 6–20)
CO2: 25 mmol/L (ref 22–32)
Calcium: 9.5 mg/dL (ref 8.9–10.3)
Chloride: 104 mmol/L (ref 98–111)
Creatinine, Ser: 0.92 mg/dL (ref 0.61–1.24)
GFR calc Af Amer: >60 mL/min (ref >60)
GFR calc non Af Amer: >60 mL/min (ref >60)
Glucose, Bld: 107 mg/dL — ABNORMAL HIGH (ref 70–99)
Potassium: 4.5 mmol/L (ref 3.5–5.1)
Sodium: 136 mmol/L (ref 135–145)

## 2018-10-21 LAB — CBC WITH DIFFERENTIAL/PLATELET
Abs Immature Granulocytes: 0.03 10*3/uL (ref 0.00–0.07)
Basophils Absolute: 0.1 10*3/uL (ref 0.0–0.1)
Basophils Relative: 1 %
Eosinophils Absolute: 0.2 10*3/uL (ref 0.0–0.5)
Eosinophils Relative: 2 %
HCT: 43 % (ref 39.0–52.0)
Hemoglobin: 14.4 g/dL (ref 13.0–17.0)
Immature Granulocytes: 0 %
Lymphocytes Relative: 29 %
Lymphs Abs: 2.5 10*3/uL (ref 0.7–4.0)
MCH: 31.6 pg (ref 26.0–34.0)
MCHC: 33.5 g/dL (ref 30.0–36.0)
MCV: 94.5 fL (ref 80.0–100.0)
Monocytes Absolute: 0.7 10*3/uL (ref 0.1–1.0)
Monocytes Relative: 8 %
Neutro Abs: 5.3 10*3/uL (ref 1.7–7.7)
Neutrophils Relative %: 60 %
Platelets: 278 10*3/uL (ref 150–400)
RBC: 4.55 MIL/uL (ref 4.22–5.81)
RDW: 12.7 % (ref 11.5–15.5)
WBC: 8.8 10*3/uL (ref 4.0–10.5)
nRBC: 0 % (ref 0.0–0.2)

## 2018-10-21 NOTE — Progress Notes (Signed)
SPOKE W/  [place]     SCREENING SYMPTOMS OF COVID 19:   COUGH--NO  RUNNY NOSE--- NO  SORE THROAT---NO  NASAL CONGESTION----NO  SNEEZING----NO  SHORTNESS OF BREATH---NO  DIFFICULTY BREATHING---NO  TEMP >100.0 -----NO  UNEXPLAINED BODY ACHES------NO  CHILLS -------- NO  HEADACHES ---------NO  LOSS OF SMELL/ TASTE --------NO    HAVE YOU OR ANY FAMILY MEMBER TRAVELLED PAST 14 DAYS OUT OF THE   COUNTY---Lives in [facility] STATE----Lives in [place], [place] COUNTRY----NO  HAVE YOU OR ANY FAMILY MEMBER BEEN EXPOSED TO ANYONE WITH COVID 19? NO

## 2018-10-21 NOTE — Progress Notes (Signed)
PCP - Dr. Margaretha Sheffield  Cardiologist - N/A  Chest x-ray - 08/17/2018 in epic EKG - 10/21/2018 in epic Stress Test -N/A  ECHO - 07/23/2017 results in care everywhere results in note 07/28/2017 Dr. Regenia Skeeter Cardiac Cath - N/A  Sleep Study - Yes CPAP - wears CPAP a few times per month not nightly  Fasting Blood Sugar - N/A Checks Blood Sugar _N/A____ times a day  Blood Thinner Instructions:N/A Aspirin Instructions: Yes Last Dose: 9/0/2020  Anesthesia review: N/A  Patient denies shortness of breath, fever, cough and chest pain at PAT appointment   Patient verbalized understanding of instructions that were given to them at the PAT appointment. Patient was also instructed that they will need to review over the PAT instructions again at home before surgery.

## 2018-10-22 ENCOUNTER — Other Ambulatory Visit (HOSPITAL_COMMUNITY): Payer: BC Managed Care – PPO

## 2018-10-22 ENCOUNTER — Ambulatory Visit: Payer: BC Managed Care – PPO | Admitting: Gastroenterology

## 2018-10-22 ENCOUNTER — Other Ambulatory Visit (HOSPITAL_COMMUNITY)
Admission: RE | Admit: 2018-10-22 | Discharge: 2018-10-22 | Disposition: A | Discharge status: Home / Self Care | Payer: BC Managed Care – PPO | Source: Ambulatory Visit | Attending: Surgery | Admitting: Surgery

## 2018-10-22 DIAGNOSIS — Z20828 Contact with and (suspected) exposure to other viral communicable diseases: Secondary | ICD-10-CM | POA: Insufficient documentation

## 2018-10-22 DIAGNOSIS — Z01812 Encounter for preprocedural laboratory examination: Secondary | ICD-10-CM | POA: Insufficient documentation

## 2018-10-22 LAB — SARS CORONAVIRUS 2 (TAT 6-24 HRS): SARS Coronavirus 2: NEGATIVE

## 2018-10-25 MED ORDER — BUPIVACAINE LIPOSOME 1.3 % IJ SUSP
20.0000 mL | Freq: Once | INTRAMUSCULAR | Status: DC
  Filled 2018-10-25: qty 20

## 2018-10-26 ENCOUNTER — Ambulatory Visit (HOSPITAL_COMMUNITY): Payer: BC Managed Care – PPO | Admitting: Physician Assistant

## 2018-10-26 ENCOUNTER — Ambulatory Visit (HOSPITAL_COMMUNITY)
Admission: RE | Admit: 2018-10-26 | Discharge: 2018-10-26 | Disposition: A | Discharge status: Home / Self Care | Payer: BC Managed Care – PPO | Attending: Surgery | Admitting: Surgery

## 2018-10-26 ENCOUNTER — Other Ambulatory Visit: Payer: Self-pay

## 2018-10-26 ENCOUNTER — Ambulatory Visit (HOSPITAL_COMMUNITY): Payer: BC Managed Care – PPO

## 2018-10-26 ENCOUNTER — Encounter (HOSPITAL_COMMUNITY): Payer: Self-pay | Admitting: *Deleted

## 2018-10-26 ENCOUNTER — Encounter (HOSPITAL_COMMUNITY)
Admission: RE | Disposition: A | Discharge status: Home / Self Care | Payer: Self-pay | Source: Home / Self Care | Attending: Surgery

## 2018-10-26 DIAGNOSIS — K219 Gastro-esophageal reflux disease without esophagitis: Secondary | ICD-10-CM | POA: Diagnosis not present

## 2018-10-26 DIAGNOSIS — K4090 Unilateral inguinal hernia, without obstruction or gangrene, not specified as recurrent: Secondary | ICD-10-CM | POA: Insufficient documentation

## 2018-10-26 DIAGNOSIS — Z86010 Personal history of colonic polyps: Secondary | ICD-10-CM | POA: Insufficient documentation

## 2018-10-26 DIAGNOSIS — F17210 Nicotine dependence, cigarettes, uncomplicated: Secondary | ICD-10-CM | POA: Diagnosis not present

## 2018-10-26 DIAGNOSIS — E785 Hyperlipidemia, unspecified: Secondary | ICD-10-CM | POA: Insufficient documentation

## 2018-10-26 DIAGNOSIS — Z79899 Other long term (current) drug therapy: Secondary | ICD-10-CM | POA: Insufficient documentation

## 2018-10-26 DIAGNOSIS — I10 Essential (primary) hypertension: Secondary | ICD-10-CM | POA: Insufficient documentation

## 2018-10-26 DIAGNOSIS — M1990 Unspecified osteoarthritis, unspecified site: Secondary | ICD-10-CM | POA: Diagnosis not present

## 2018-10-26 DIAGNOSIS — Z7982 Long term (current) use of aspirin: Secondary | ICD-10-CM | POA: Diagnosis not present

## 2018-10-26 DIAGNOSIS — Z8673 Personal history of transient ischemic attack (TIA), and cerebral infarction without residual deficits: Secondary | ICD-10-CM | POA: Insufficient documentation

## 2018-10-26 DIAGNOSIS — G4733 Obstructive sleep apnea (adult) (pediatric): Secondary | ICD-10-CM | POA: Diagnosis not present

## 2018-10-26 DIAGNOSIS — Z8249 Family history of ischemic heart disease and other diseases of the circulatory system: Secondary | ICD-10-CM | POA: Insufficient documentation

## 2018-10-26 DIAGNOSIS — K5790 Diverticulosis of intestine, part unspecified, without perforation or abscess without bleeding: Secondary | ICD-10-CM | POA: Insufficient documentation

## 2018-10-26 HISTORY — PX: INGUINAL HERNIA REPAIR: SHX194

## 2018-10-26 SURGERY — REPAIR, HERNIA, INGUINAL, ADULT
Anesthesia: General | Site: Inguinal | Laterality: Left

## 2018-10-26 MED ORDER — OXYCODONE HCL 5 MG/5ML PO SOLN: 5.0000 mg | Freq: Once | ORAL | Status: AC | PRN

## 2018-10-26 MED ORDER — SUGAMMADEX SODIUM 200 MG/2ML IV SOLN
INTRAVENOUS | Status: DC | PRN
  Administered 2018-10-26: 200 mg via INTRAVENOUS

## 2018-10-26 MED ORDER — LIDOCAINE 20MG/ML (2%) 15 ML SYRINGE OPTIME
INTRAMUSCULAR | Status: DC | PRN
  Administered 2018-10-26: 1.5 mg/kg/h via INTRAVENOUS

## 2018-10-26 MED ORDER — OXYCODONE HCL 5 MG PO TABS
ORAL_TABLET | ORAL | Status: AC
  Filled 2018-10-26: qty 1

## 2018-10-26 MED ORDER — KETAMINE HCL 10 MG/ML IJ SOLN
INTRAMUSCULAR | Status: DC | PRN
  Administered 2018-10-26: 50 mg via INTRAVENOUS

## 2018-10-26 MED ORDER — BUPIVACAINE HCL (PF) 0.25 % IJ SOLN
INTRAMUSCULAR | Status: DC | PRN
  Administered 2018-10-26: 10 mL

## 2018-10-26 MED ORDER — ALBUTEROL SULFATE HFA 108 (90 BASE) MCG/ACT IN AERS
INHALATION_SPRAY | RESPIRATORY_TRACT | Status: DC | PRN
  Administered 2018-10-26: 5 via RESPIRATORY_TRACT

## 2018-10-26 MED ORDER — GABAPENTIN 300 MG PO CAPS
300.0000 mg | ORAL_CAPSULE | ORAL | Status: AC
  Administered 2018-10-26: 300 mg via ORAL
  Filled 2018-10-26: qty 1

## 2018-10-26 MED ORDER — ONDANSETRON HCL 4 MG/2ML IJ SOLN
INTRAMUSCULAR | Status: AC
  Filled 2018-10-26: qty 2

## 2018-10-26 MED ORDER — CHLORHEXIDINE GLUCONATE 4 % EX LIQD: 60.0000 mL | Freq: Once | CUTANEOUS | Status: DC

## 2018-10-26 MED ORDER — BUPIVACAINE HCL (PF) 0.25 % IJ SOLN
INTRAMUSCULAR | Status: AC
  Filled 2018-10-26: qty 30

## 2018-10-26 MED ORDER — LACTATED RINGERS IV SOLN
INTRAVENOUS | Status: DC
  Administered 2018-10-26: 11:00:00 via INTRAVENOUS

## 2018-10-26 MED ORDER — OXYCODONE-ACETAMINOPHEN 5-325 MG PO TABS: 1.0000 | ORAL_TABLET | Freq: Four times a day (QID) | ORAL | 0 refills | Status: AC | PRN

## 2018-10-26 MED ORDER — 0.9 % SODIUM CHLORIDE (POUR BTL) OPTIME
TOPICAL | Status: DC | PRN
  Administered 2018-10-26: 1000 mL

## 2018-10-26 MED ORDER — DEXAMETHASONE SODIUM PHOSPHATE 10 MG/ML IJ SOLN
INTRAMUSCULAR | Status: DC | PRN
  Administered 2018-10-26: 10 mg via INTRAVENOUS

## 2018-10-26 MED ORDER — DEXAMETHASONE SODIUM PHOSPHATE 10 MG/ML IJ SOLN
INTRAMUSCULAR | Status: AC
  Filled 2018-10-26: qty 1

## 2018-10-26 MED ORDER — MIDAZOLAM HCL 2 MG/2ML IJ SOLN
INTRAMUSCULAR | Status: AC
  Filled 2018-10-26: qty 2

## 2018-10-26 MED ORDER — OXYCODONE HCL 5 MG PO TABS
5.0000 mg | ORAL_TABLET | Freq: Once | ORAL | Status: AC | PRN
  Administered 2018-10-26: 5 mg via ORAL

## 2018-10-26 MED ORDER — PROPOFOL 10 MG/ML IV BOLUS
INTRAVENOUS | Status: DC | PRN
  Administered 2018-10-26: 200 mg via INTRAVENOUS

## 2018-10-26 MED ORDER — ACETAMINOPHEN 650 MG RE SUPP
650.0000 mg | RECTAL | Status: DC | PRN
  Filled 2018-10-26: qty 1

## 2018-10-26 MED ORDER — LIDOCAINE 2% (20 MG/ML) 5 ML SYRINGE
INTRAMUSCULAR | Status: DC | PRN
  Administered 2018-10-26: 40 mg via INTRAVENOUS
  Administered 2018-10-26: 60 mg via INTRAVENOUS

## 2018-10-26 MED ORDER — OXYCODONE HCL 5 MG PO TABS: 5.0000 mg | ORAL_TABLET | ORAL | Status: DC | PRN

## 2018-10-26 MED ORDER — ONDANSETRON HCL 4 MG/2ML IJ SOLN
4.0000 mg | Freq: Four times a day (QID) | INTRAMUSCULAR | Status: AC | PRN
  Administered 2018-10-26: 4 mg via INTRAVENOUS

## 2018-10-26 MED ORDER — VANCOMYCIN HCL IN DEXTROSE 1-5 GM/200ML-% IV SOLN
1000.0000 mg | INTRAVENOUS | Status: AC
  Administered 2018-10-26: 11:00:00 1000 mg via INTRAVENOUS
  Filled 2018-10-26: qty 200

## 2018-10-26 MED ORDER — PROPOFOL 10 MG/ML IV BOLUS
INTRAVENOUS | Status: AC
  Filled 2018-10-26: qty 20

## 2018-10-26 MED ORDER — ROCURONIUM BROMIDE 10 MG/ML (PF) SYRINGE
PREFILLED_SYRINGE | INTRAVENOUS | Status: AC
  Filled 2018-10-26: qty 10

## 2018-10-26 MED ORDER — SODIUM CHLORIDE 0.9% FLUSH: 3.0000 mL | Freq: Two times a day (BID) | INTRAVENOUS | Status: DC

## 2018-10-26 MED ORDER — FENTANYL CITRATE (PF) 100 MCG/2ML IJ SOLN: 25.0000 ug | INTRAMUSCULAR | Status: DC | PRN

## 2018-10-26 MED ORDER — MIDAZOLAM HCL 2 MG/2ML IJ SOLN
1.0000 mg | INTRAMUSCULAR | Status: DC
  Administered 2018-10-26: 2 mg via INTRAVENOUS
  Filled 2018-10-26: qty 2

## 2018-10-26 MED ORDER — FENTANYL CITRATE (PF) 100 MCG/2ML IJ SOLN
50.0000 ug | INTRAMUSCULAR | Status: DC
  Administered 2018-10-26: 100 ug via INTRAVENOUS
  Filled 2018-10-26: qty 2

## 2018-10-26 MED ORDER — SODIUM CHLORIDE 0.9% FLUSH: 3.0000 mL | INTRAVENOUS | Status: DC | PRN

## 2018-10-26 MED ORDER — ACETAMINOPHEN 325 MG PO TABS: 650.0000 mg | ORAL_TABLET | ORAL | Status: DC | PRN

## 2018-10-26 MED ORDER — DOCUSATE SODIUM 100 MG PO CAPS: 100.0000 mg | ORAL_CAPSULE | Freq: Two times a day (BID) | ORAL | 0 refills | Status: AC

## 2018-10-26 MED ORDER — MIDAZOLAM HCL 5 MG/5ML IJ SOLN
INTRAMUSCULAR | Status: DC | PRN
  Administered 2018-10-26: 1 mg via INTRAVENOUS

## 2018-10-26 MED ORDER — SUCCINYLCHOLINE CHLORIDE 200 MG/10ML IV SOSY
PREFILLED_SYRINGE | INTRAVENOUS | Status: AC
  Filled 2018-10-26: qty 10

## 2018-10-26 MED ORDER — ONDANSETRON HCL 4 MG/2ML IJ SOLN
INTRAMUSCULAR | Status: DC | PRN
  Administered 2018-10-26: 4 mg via INTRAVENOUS

## 2018-10-26 MED ORDER — LIDOCAINE 2% (20 MG/ML) 5 ML SYRINGE
INTRAMUSCULAR | Status: AC
  Filled 2018-10-26: qty 5

## 2018-10-26 MED ORDER — SODIUM CHLORIDE 0.9 % IV SOLN: 250.0000 mL | INTRAVENOUS | Status: DC | PRN

## 2018-10-26 MED ORDER — ROCURONIUM BROMIDE 10 MG/ML (PF) SYRINGE
PREFILLED_SYRINGE | INTRAVENOUS | Status: DC | PRN
  Administered 2018-10-26: 20 mg via INTRAVENOUS
  Administered 2018-10-26: 30 mg via INTRAVENOUS
  Administered 2018-10-26: 10 mg via INTRAVENOUS

## 2018-10-26 MED ORDER — PROMETHAZINE HCL 12.5 MG PO TABS: 12.5000 mg | ORAL_TABLET | Freq: Four times a day (QID) | ORAL | 0 refills | Status: AC | PRN

## 2018-10-26 MED ORDER — ACETAMINOPHEN 500 MG PO TABS
1000.0000 mg | ORAL_TABLET | ORAL | Status: AC
  Administered 2018-10-26: 11:00:00 1000 mg via ORAL
  Filled 2018-10-26: qty 2

## 2018-10-26 MED ORDER — FENTANYL CITRATE (PF) 250 MCG/5ML IJ SOLN
INTRAMUSCULAR | Status: AC
  Filled 2018-10-26: qty 5

## 2018-10-26 MED ORDER — LIDOCAINE HCL 2 % IJ SOLN
INTRAMUSCULAR | Status: AC
  Filled 2018-10-26: qty 20

## 2018-10-26 MED ORDER — SUCCINYLCHOLINE CHLORIDE 200 MG/10ML IV SOSY
PREFILLED_SYRINGE | INTRAVENOUS | Status: DC | PRN
  Administered 2018-10-26: 120 mg via INTRAVENOUS

## 2018-10-26 MED ORDER — CHLORHEXIDINE GLUCONATE 4 % EX LIQD
60.0000 mL | Freq: Once | CUTANEOUS | Status: AC
  Administered 2018-10-26: 4 via TOPICAL

## 2018-10-26 MED ORDER — BUPIVACAINE-EPINEPHRINE (PF) 0.5% -1:200000 IJ SOLN
INTRAMUSCULAR | Status: DC | PRN
  Administered 2018-10-26: 25 mL via PERINEURAL

## 2018-10-26 MED ORDER — FENTANYL CITRATE (PF) 250 MCG/5ML IJ SOLN
INTRAMUSCULAR | Status: DC | PRN
  Administered 2018-10-26: 50 ug via INTRAVENOUS
  Administered 2018-10-26 (×2): 25 ug via INTRAVENOUS
  Administered 2018-10-26 (×2): 50 ug via INTRAVENOUS

## 2018-10-26 SURGICAL SUPPLY — 37 items
BENZOIN TINCTURE PRP APPL 2/3 (GAUZE/BANDAGES/DRESSINGS) ×2 IMPLANT
BLADE SURG 15 STRL LF DISP TIS (BLADE) ×1 IMPLANT
BLADE SURG 15 STRL SS (BLADE) ×1
CHLORAPREP W/TINT 26 (MISCELLANEOUS) ×2 IMPLANT
COVER SURGICAL LIGHT HANDLE (MISCELLANEOUS) ×2 IMPLANT
COVER WAND RF STERILE (DRAPES) IMPLANT
DECANTER SPIKE VIAL GLASS SM (MISCELLANEOUS) ×2 IMPLANT
DRAIN PENROSE 18X1/2 LTX STRL (DRAIN) ×2 IMPLANT
DRAPE LAPAROSCOPIC ABDOMINAL (DRAPES) ×2 IMPLANT
ELECT PENCIL ROCKER SW 15FT (MISCELLANEOUS) ×2 IMPLANT
ELECT REM PT RETURN 15FT ADLT (MISCELLANEOUS) ×2 IMPLANT
GAUZE SPONGE 4X4 12PLY STRL (GAUZE/BANDAGES/DRESSINGS) ×1 IMPLANT
GLOVE BIO SURGEON STRL SZ 6 (GLOVE) ×2 IMPLANT
GLOVE INDICATOR 6.5 STRL GRN (GLOVE) ×2 IMPLANT
GOWN STRL REUS W/TWL LRG LVL3 (GOWN DISPOSABLE) ×2 IMPLANT
GOWN STRL REUS W/TWL XL LVL3 (GOWN DISPOSABLE) ×2 IMPLANT
KIT BASIN OR (CUSTOM PROCEDURE TRAY) ×2 IMPLANT
KIT TURNOVER KIT A (KITS) IMPLANT
MESH ULTRAPRO 3X6 7.6X15CM (Mesh General) ×1 IMPLANT
NEEDLE HYPO 22GX1.5 SAFETY (NEEDLE) ×2 IMPLANT
PACK BASIC VI WITH GOWN DISP (CUSTOM PROCEDURE TRAY) ×2 IMPLANT
SPONGE LAP 4X18 RFD (DISPOSABLE) ×2 IMPLANT
STRIP CLOSURE SKIN 1/2X4 (GAUZE/BANDAGES/DRESSINGS) ×2 IMPLANT
SUT ETHIBOND 0 MO6 C/R (SUTURE) ×2 IMPLANT
SUT MNCRL AB 4-0 PS2 18 (SUTURE) ×2 IMPLANT
SUT SILK 3 0 (SUTURE) ×1
SUT SILK 3-0 18XBRD TIE 12 (SUTURE) ×1 IMPLANT
SUT VIC AB 3-0 SH 27 (SUTURE) ×2
SUT VIC AB 3-0 SH 27XBRD (SUTURE) ×2 IMPLANT
SUT VICRYL 3 0 BR 18  UND (SUTURE) ×1
SUT VICRYL 3 0 BR 18 UND (SUTURE) IMPLANT
SYR CONTROL 10ML LL (SYRINGE) ×2 IMPLANT
TAPE CLOTH SURG 4X10 WHT LF (GAUZE/BANDAGES/DRESSINGS) ×1 IMPLANT
TOWEL OR 17X26 10 PK STRL BLUE (TOWEL DISPOSABLE) ×2 IMPLANT
TOWEL OR NON WOVEN STRL DISP B (DISPOSABLE) ×2 IMPLANT
TRAY FOLEY MTR SLVR 16FR STAT (SET/KITS/TRAYS/PACK) IMPLANT
YANKAUER SUCT BULB TIP 10FT TU (MISCELLANEOUS) IMPLANT

## 2018-10-26 NOTE — Progress Notes (Signed)
Assisted Dr. Hodierne with left, ultrasound guided, transabdominal plane block. Side rails up, monitors on throughout procedure. See vital signs in flow sheet. Tolerated Procedure well. 

## 2018-10-26 NOTE — Anesthesia Procedure Notes (Signed)
Anesthesia Regional Block: TAP block   Pre-Anesthetic Checklist: ,, timeout performed, Correct Patient, Correct Site, Correct Laterality, Correct Procedure, Correct Position, site marked, Risks and benefits discussed,  Surgical consent,  Pre-op evaluation,  At surgeon's request and post-op pain management  Laterality: Left  Prep: chloraprep       Needles:  Injection technique: Single-shot  Needle Type: Echogenic Needle     Needle Length: 9cm  Needle Gauge: 21     Additional Needles:   Narrative:  Start time: 10/26/2018 10:57 AM End time: 10/26/2018 11:03 AM Injection made incrementally with aspirations every 5 mL.  Performed by: Personally  Anesthesiologist: Albertha Ghee, MD  Additional Notes: Pt tolerated the procedure well.

## 2018-10-26 NOTE — Discharge Instructions (Signed)
HERNIA REPAIR: POST OP INSTRUCTIONS  ######################################################################  EAT Gradually transition to a high fiber diet with a fiber supplement over the next few weeks after discharge.  Start with a pureed / full liquid diet (see below)  WALK Walk an hour a day.  Control your pain to do that.    CONTROL PAIN Control pain so that you can walk, sleep, tolerate sneezing/coughing, and go up/down stairs.  HAVE A BOWEL MOVEMENT DAILY Keep your bowels regular to avoid problems.  OK to try a laxative to override constipation.  OK to use an antidairrheal to slow down diarrhea.  Call if not better after 2 tries  CALL IF YOU HAVE PROBLEMS/CONCERNS Call if you are still struggling despite following these instructions. Call if you have concerns not answered by these instructions  ######################################################################    1. DIET: Follow a light bland diet & liquids the first 24 hours after arrival home, such as soup, liquids, starches, etc.  Be sure to drink plenty of fluids.  Quickly advance to a usual solid diet within a few days.  Avoid fast food or heavy meals as your are more likely to get nauseated or have irregular bowels.  A low-sugar, high-fiber diet for the rest of your life is ideal.   2. Take your usually prescribed home medications unless otherwise directed.  3. PAIN CONTROL: a. Pain is best controlled by a usual combination of three different methods TOGETHER: i. Ice/Heat ii. Over the counter pain medication iii. Prescription pain medication b. Most patients will experience some swelling and bruising around the hernia(s) such as the bellybutton, groins, or old incisions.  Ice packs or heating pads (30-60 minutes up to 6 times a day) will help. Use ice for the first few days to help decrease swelling and bruising, then switch to heat to help relax tight/sore spots and speed recovery.  Some people prefer to use ice  alone, heat alone, alternating between ice & heat.  Experiment to what works for you.  Swelling and bruising can take several weeks to resolve.   i. It is helpful to take an over-the-counter pain medication regularly for the first few weeks.  Alternate the following, around the clock, for the first 3-4 days after surgery: ii. Naproxen (Aleve, etc)  Two 220mg  tabs twice a day OR Ibuprofen (Advil, etc) Three 200mg  tabs four times a day (every meal & bedtime) AND iii. Acetaminophen (Tylenol, etc) 325-650mg  four times a day (every meal & bedtime) c. A  prescription for pain medication should be given to you upon discharge.  Take your pain medication as prescribed, IF NEEDED.  i. If you are having problems/concerns with the prescription medicine (does not control pain, nausea, vomiting, rash, itching, etc), please call us [phone] to see if we need to switch you to a different pain medicine that will work better for you and/or control your side effect better. ii. If you need a refill on your pain medication, please contact your pharmacy.  They will contact our office to request authorization. Prescriptions will not be filled after 5 pm or on week-ends.  4. Avoid getting constipated.  Between the surgery and the pain medications, it is common to experience some constipation.  Increasing fluid intake and taking a fiber supplement (such as Metamucil, Citrucel, FiberCon, MiraLax, etc) 1-2 times a day regularly will usually help prevent this problem from occurring.  A mild laxative (prune juice, Milk of Magnesia, MiraLax, etc) should be taken according to package directions if there  are no bowel movements after 48 hours.    5. Wash / shower every day, starting 2 days after surgery.  You may shower over the steri strips as they are waterproof.   NO soaking or swimming for at least 2 weeks. No rubbing, scrubbing, lotions or ointments to incisions.  6. Remove your outer bandage 2 days after surgery. Steri  strips will peel off after 1-2 weeks. You may replace a dressing/Band-Aid to cover the incision for comfort if you wish. You may leave the incisions open to air. Continue to shower over incision(s) after the dressing is off.  7. ACTIVITIES as tolerated:   a. You may resume regular (light) daily activities beginning the next day--such as daily self-care, walking, climbing stairs--gradually increasing activities as tolerated.  Control your pain so that you can walk an hour a day.  If you can walk 30 minutes without difficulty, it is safe to try more intense activity such as jogging, treadmill, bicycling, low-impact aerobics, swimming, etc. b. Refrain from the most intensive and strenuous activity such as sit-ups, heavy lifting, contact sports, etc  Refrain from any heavy lifting or straining until 6 weeks after surgery.   c. DO NOT PUSH THROUGH PAIN.  Let pain be your guide: If it hurts to do something, don't do it.  Pain is your body warning you to avoid that activity for another week until the pain goes down. d. You may drive when you are no longer taking prescription pain medication, you can comfortably wear a seatbelt, and you can safely maneuver your car and apply brakes. e. Dennis Bast may have sexual intercourse when it is comfortable.   8. FOLLOW UP in our office a. Please call [facility] at [phone] to set up an appointment to see your surgeon in the office for a follow-up appointment approximately 2-3 weeks after your surgery. b. Make sure that you call for this appointment the day you arrive home to insure a convenient appointment time.  9.  If you have disability of FMLA / Family leave forms, please bring the forms to the office for processing.  (do not give to your surgeon).  WHEN TO CALL us [phone]: 1. Poor pain control 2. Reactions / problems with new medications (rash/itching, nausea, etc)  3. Fever over 101.5 F (38.5 C) 4. Inability to urinate 5. Nausea and/or vomiting 6. Worsening  swelling or bruising 7. Continued bleeding from incision. 8. Increased pain, redness, or drainage from the incision   The clinic staff is available to answer your questions during regular business hours (8:30am-5pm).  Please dont hesitate to call and ask to speak to one of our nurses for clinical concerns.   If you have a medical emergency, go to the nearest emergency room or call 911.  A surgeon from [facility] is always on call at the hospitals in [facility], [place], [place], [place], [place]  [address] ?  [address] [address], [place], [place]   [address] MAIN: [phone] ? TOLL FREE: [phone] ? FAX: [phone] [email or web address]

## 2018-10-26 NOTE — Op Note (Signed)
Operative Note  Colton Walker  [id number]  [id number]  10/26/2018   Surgeon: Clovis Riley MD FACS   Assistant: OR staff   Procedure performed: Open inguinal hernia repair with UltraPro mesh: left direct. Excision of large cord lipoma.    Preop diagnosis:  left inguinal hernia   Post-op diagnosis/intraop findings: large lipoma of the cord, no indirect sac, moderate direct defect    Specimens: none   EBL: 5cc   Complications: none   Description of procedure: After obtaining informed consent and placement of a TAPS block in holding by Dr. Marcie Bal, the patient was taken to the operating room and placed supine on operating room table where general anesthesia was initiated, preoperative antibiotics were administered, SCDs applied, and a formal timeout was performed. Foley catheter inserted which is removed at the end of the case. The groin was clipped, prepped and draped in the usual sterile fashion. An oblique incision was made in the just above the inguinal ligament after infiltrating the tissues with local anesthetic. Soft tissues were dissected using electrocautery until the external oblique aponeurosis was encountered. This was divided sharply to expand the external ring. A plane was bluntly developed between the spermatic cord and the external oblique. The ilioinguinal nerve was identified, divided between hemostats and each end ligated with 3-0 vicryl ties. The spermatic cord was then bluntly dissected away from the pubic tubercle and encircled with a Penrose. Inspection of the inguinal anatomy revealed a large cord lipoma but no indirect hernia sac, and a moderate direct defect. The floor of the inguinal canal was somewhat attenuated and the femoral vessels were easily appreciated beneath this. The spermatic cord was carefully dissected in attempt to identify any indirect sac, which was absent. There was a large lipoma of the cord which was explored and then clamped at the level of the  internal ring, excised, and the stalk ligated with a 3-0 vicryl tie. The direct inguinal hernia was reduced, and a figure of eight suture of 3-0 Vicryl was used to reapproximate the floor of the inguinal canal to flatten the surface and keep the hernia out of the field. A 3 x 6 piece of ultra Pro mesh was brought onto the field and trimmed to approximate the field. This was tacked to the pubic tubercle fascia using 0 ethibond. Interrupted 0 ethibonds were then used to tack the mesh to the inferior shelving edge and to the internal oblique superiorly. The tails of the mesh were wrapped around the spermatic cord, ensuring adequate room for the cord, and tacked to each other with 0 ethibond, and then directed laterally to lie flat. Hemostasis was ensured within the wound. The Penrose was removed. The external oblique aponeurosis was reapproximated with a running 3-0 Vicryl to re-create a narrowed external ring. More local was infiltrated around the pubic tubercle and in the plane just below the external oblique. The Scarpa's was reapproximated with interrupted 3-0 Vicryls. The skin was closed with a running subcuticular Monocryl. The remainder of the local was injected in the subcutaneous and subcuticular space. The field was then cleaned, benzoin and Steri-Strips and sterile bandage were applied. Both testicles were palpated in the scrotum at the end of the case and clear urine noted in the foley bag and tubing. Pressure was held on the left groin throughout emergence as the patient did have vigorous coughing. The patient was then awakened extubated and taken to PACU in stable condition.    All counts were correct at the completion of  the case

## 2018-10-26 NOTE — Anesthesia Preprocedure Evaluation (Signed)
Anesthesia Evaluation  Patient identified by MRN, date of birth, ID band Patient awake    Reviewed: Allergy & Precautions, H&P , NPO status , Patient's Chart, lab work & pertinent test results  Airway Mallampati: II   Neck ROM: full    Dental   Pulmonary sleep apnea and Continuous Positive Airway Pressure Ventilation , Current Smoker,    breath sounds clear to auscultation       Cardiovascular hypertension,  Rhythm:regular Rate:Normal     Neuro/Psych  Headaches, TIA Neuromuscular disease    GI/Hepatic GERD  ,  Endo/Other    Renal/GU stones     Musculoskeletal  (+) Arthritis ,   Abdominal   Peds  Hematology   Anesthesia Other Findings   Reproductive/Obstetrics                             Anesthesia Physical Anesthesia Plan  ASA: III  Anesthesia Plan: General   Post-op Pain Management:  Regional for Post-op pain   Induction: Intravenous  PONV Risk Score and Plan: 1 and Ondansetron, Dexamethasone, Midazolam and Treatment may vary due to age or medical condition  Airway Management Planned: Oral ETT  Additional Equipment:   Intra-op Plan:   Post-operative Plan: Extubation in OR  Informed Consent: I have reviewed the patients History and Physical, chart, labs and discussed the procedure including the risks, benefits and alternatives for the proposed anesthesia with the patient or authorized representative who has indicated his/her understanding and acceptance.       Plan Discussed with: CRNA, Anesthesiologist and Surgeon  Anesthesia Plan Comments:         Anesthesia Quick Evaluation

## 2018-10-26 NOTE — Transfer of Care (Signed)
Immediate Anesthesia Transfer of Care Note  Patient: Daeron Aardema  Procedure(s) Performed: Procedure(s): OPEN LEFT INGUINAL HERNIA REPAIR WITH MESH (Left)  Patient Location: PACU  Anesthesia Type:General  Level of Consciousness: Patient easily awoken, sedated, comfortable, cooperative, following commands, responds to stimulation.   Airway & Oxygen Therapy: Patient spontaneously breathing, ventilating well, oxygen via simple oxygen mask.  Post-op Assessment: Report given to PACU RN, vital signs reviewed and stable, moving all extremities.   Post vital signs: Reviewed and stable.  Complications: No apparent anesthesia complications  Last Vitals:  Vitals Value Taken Time  BP 158/96 10/26/18 1321  Temp    Pulse 77 10/26/18 1323  Resp 19 10/26/18 1323  SpO2 87 % 10/26/18 1323  Vitals shown include unvalidated device data.  Last Pain:  Vitals:   10/26/18 1019  TempSrc: Oral         Complications: No apparent anesthesia complications

## 2018-10-26 NOTE — Interval H&P Note (Signed)
History and Physical Interval Note:  10/26/2018 11:05 AM  Colton Walker  has presented today for surgery, with the diagnosis of LEFT INGUINAL HERNIA.  The various methods of treatment have been discussed with the patient and family. After consideration of risks, benefits and other options for treatment, the patient has consented to  Procedure(s): OPEN LEFT INGUINAL HERNIA REPAIR WITH MESH (Left) as a surgical intervention.  The patient's history has been reviewed, patient examined, no change in status, stable for surgery.  I have reviewed the patient's chart and labs.  Questions were answered to the patient's satisfaction.     Colton Walker

## 2018-10-26 NOTE — Anesthesia Procedure Notes (Signed)
Procedure Name: Intubation Date/Time: 10/26/2018 11:36 AM Performed by: Deliah Boston, CRNA Pre-anesthesia Checklist: Patient identified, Emergency Drugs available, Suction available and Patient being monitored Patient Re-evaluated:Patient Re-evaluated prior to induction Oxygen Delivery Method: Circle system utilized Preoxygenation: Pre-oxygenation with 100% oxygen Induction Type: IV induction Ventilation: Mask ventilation without difficulty Laryngoscope Size: Mac and 3 Grade View: Grade I Tube type: Oral Tube size: 7.5 mm Number of attempts: 1 Airway Equipment and Method: Stylet and Oral airway Placement Confirmation: ETT inserted through vocal cords under direct vision,  positive ETCO2 and breath sounds checked- equal and bilateral Secured at: 21 cm Tube secured with: Tape Dental Injury: Teeth and Oropharynx as per pre-operative assessment  Comments: Grade 1 view with anterior pressure, Grade 2 without anterior pressure

## 2018-10-26 NOTE — Progress Notes (Signed)
Called patient's wife to update her that her husband has left for surgery. Next person to call her will be Dr Kae Heller.

## 2018-10-27 ENCOUNTER — Encounter (HOSPITAL_COMMUNITY): Payer: Self-pay | Admitting: Surgery

## 2018-10-27 LAB — SURGICAL PATHOLOGY

## 2018-10-29 ENCOUNTER — Ambulatory Visit: Payer: BC Managed Care – PPO | Admitting: Gastroenterology

## 2018-10-29 NOTE — Anesthesia Postprocedure Evaluation (Signed)
Anesthesia Post Note  Patient: Primary school teacher  Procedure(s) Performed: OPEN LEFT INGUINAL HERNIA REPAIR WITH MESH (Left Inguinal)     Patient location during evaluation: PACU Anesthesia Type: General Level of consciousness: awake and alert Pain management: pain level controlled Vital Signs Assessment: post-procedure vital signs reviewed and stable Respiratory status: spontaneous breathing, nonlabored ventilation, respiratory function stable and patient connected to nasal cannula oxygen Cardiovascular status: blood pressure returned to baseline and stable Postop Assessment: no apparent nausea or vomiting Anesthetic complications: no    Last Vitals:  Vitals:   10/26/18 1400 10/26/18 1415  BP: 137/76 120/76  Pulse: 76 64  Resp: (!) 21 13  Temp:  (!) 36.4 C  SpO2: 94% 95%    Last Pain:  Vitals:   10/26/18 1415  TempSrc:   PainSc: [place]

## 2018-11-30 ENCOUNTER — Ambulatory Visit: Payer: BC Managed Care – PPO | Admitting: Gastroenterology

## 2019-02-18 ENCOUNTER — Telehealth: Payer: Self-pay | Admitting: Gastroenterology

## 2019-02-18 NOTE — Telephone Encounter (Signed)
Patient called and has concerns and questions in reference to weight loss

## 2019-02-19 NOTE — Telephone Encounter (Signed)
The pt called to complain about a 15 pound weight loss in a week. After speaking with him he tells me he has been diagnosed with strep throat and is not eating.  He was seen by PCP and is being treated but does not feel like he is getting any better.  He also tested negative for COVID.  He is concerned about the weight loss.  I advised him to call his PCP and let them know he is not better and may need another evaluation. Dr Rush Landmark Juluis Rainier

## 2019-03-08 ENCOUNTER — Telehealth: Payer: Self-pay | Admitting: Gastroenterology

## 2019-03-08 NOTE — Telephone Encounter (Signed)
Agree. If progressive symptoms then further evaluation. Can be set up with one of the PAs in the next few weeks. Thanks. GM

## 2019-03-08 NOTE — Telephone Encounter (Signed)
Off/on sharp pain in lower left side abd last for several hours then stops. The episodes are becoming more painful and last longer.   Has episodes of this everyday.  Saw PCP as it was recommended he go to the ED but he refused. He was also recommended to have a CT scan by PCP to "look at my spleen" but declined that as well.   He continues to have pain and today it is a 10/10 with nausea and diaphoresis. He states " I can handle pain but I have been crying today"  He says his bowels are normal for him.  He was advised to go to urgent care or ED for evaluation.  The pt has been advised of the information and verbalized understanding.

## 2019-03-08 NOTE — Telephone Encounter (Signed)
Patient is calling requesting advise has really bad abdominal pain and doesn't know what to do.

## 2019-03-09 NOTE — Telephone Encounter (Signed)
An appt has been made to see [name] and pt notified via Colton Walker

## 2019-03-10 ENCOUNTER — Other Ambulatory Visit: Payer: Self-pay | Admitting: Gastroenterology

## 2019-03-18 NOTE — Progress Notes (Signed)
03/18/2019 Colton Walker [id number] Jul 02, 1975    Chief Complaint: Abdominal pain   History of Present Illness: Colton Walker is a 44 year old male with a past medical history of hypertension, hyperlipidemia, TIA on ASA, migraine headaches, white matter disease per brain MRI, sleep apnea on CPAP, osteoarthritis and GERD.  Last seen in office by Dr. Rush Landmark on 09/24/2018.  At that time, he was advised to continue Omeprazole 40 mg twice daily and Pepcid at bedtime.  He was referred to Dr. Bryan Lemma for consideration of TIF which was not done as his reflux symptoms improved.   He was referred to a general surgeon for inguinal hernia repair.  He is s/p  [facility] repair surgery by Dr. Geradine Girt 10/26/2018. He presents today with complaints of left mid abdominal pain which started 3 to 4 weeks ago. He was seen by his PCP 4 weeks ago due to having a sore throat and he was treated with a Z pack for strep throat. He continued to have left mid abdominal pain and his PCP ordered a CTAP which is scheduled for next week. He describes the left mid abdominal pain as a stabbing pain which comes and goes. Eating makes the pain worse. He typically passes 2 loose mud like stools in the morning. After he took the Zpak,  he passed mud like diarrhea 3 to 4 times in one day with a little blood on the toilet tissue which has not recurred. He is back to his normal bowel pattern, passing a few mud like stools daily. He takes Fibercon 1 tab bid. He cannot recall the last time he past a solid stool. He reported losing 15lbs over the past few weeks. His weight in office today is 229 lbs. His weight in office 09/24/2018 was 221lbs. Recently tested negative for COVID 19.   Abdominal/pelvic CT 08/21/2018: Stomach/Bowel: Normal morphology of the stomach. No evidence for small bowel obstruction. No free fluid or free intraperitoneal air. Normal appendix. There is mild wall thickening descending and sigmoid colon without  surrounding fat stranding suggestive of sequelae of prior episodes of colitis. Sigmoid colonic diverticulosis without CT evidence for acute diverticulitis.   EGD 08/13/2018 by Dr. Rush Landmark: - No gross lesions in esophagus. Dilated. - No gross lesions in the stomach. - No gross lesions in the duodenal bulb, in the first portion of the duodenum and in the second   portion of the duodenum.  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 Biopsied. No evidence   of a ring/stricture. - Salmon-colored mucosa suggestive of short-segment Barrett's esophagus. Biopsied. - Erythematous mucosa in the antrum. No other gross lesions in the stomach.  - No gross lesions in the duodenal bulb, in the first portion of the duodenum and in the second  portion of the duodenum.  Colonoscopy 08/13/2018 by Dr. Rush Landmark: - Hemorrhoids found on perianal exam. - A few erosions in the terminal ileum. Biopsied. - The examined portion of the ileum was normal. - Three 2 to 6 mm hyperplastic polyps in the rectum and at the recto-sigmoid colon, removed with a cold snare.  - Normal mucosa in the entire examined colon.  - Diverticulosis in the recto-sigmoid colon, in the sigmoid colon, in the descending colon and in the transverse colon. - Non-bleeding non-thrombosed external and internal hemorrhoids. - 10 year recall colonoscopy   1. Surgical [P], small bowel, terminal ileum - BENIGN SMALL BOWEL MUCOSA. - NO DYSPLASIA OR MALIGNANCY. 2. Surgical [P], colon, random sites -  BENIGN COLONIC MUCOSA. - NO ACTIVE INFLAMMATION OR EVIDENCE OF MICROSCOPIC COLITIS. - NO DYSPLASIA OR MALIGNANCY. 3. Surgical [P], colon, rectosigmoid, rectum, polyp (3) - HYPERPLASTIC POLYP. - BENIGN COLONIC MUCOSA. - NO DYSPLASIA OR MALIGNANCY  Esophageal Manometry 07/20/2018:    Current Medications, Allergies, Past Medical History, Past Surgical History, Family History and Social History were  reviewed in [employer] record.   Physical Exam: BP 140/80   Pulse 64   Temp 98.6 F (37 C)   Ht 6\' 3"  (1.905 m)   Wt 229 lb (103.9 kg)   BMI 28.62 kg/m   General: Well developed 44 year old male in no acute distress. Head: Normocephalic and atraumatic. Eyes:  No scleral icterus. Conjunctiva pink . Ears: Normal auditory acuity. Lungs: Clear throughout to auscultation. Heart: Regular rate and rhythm, no murmur. Abdomen: Soft, nondistended. Tenderness to the LUQ, RUQ and RLQ without rebound or guarding.  No masses or hepatomegaly. Normal bowel sounds x 4 quadrants.  Rectal: Deferred.  Musculoskeletal: Symmetrical with no gross deformities. Extremities: No edema. Neurological: Alert oriented x 4. No focal deficits.  Psychological:  Alert and cooperative. Normal mood and affect  Assessment and Recommendations:  38. 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 -Pt to go to the ED if he develops severe abdominal pain   2. History of hyperplastic colon polyps -Next colonoscopy due 08/2028   3. GERD, stable at this time on Omeprazole 40mg  bid

## 2019-03-19 ENCOUNTER — Ambulatory Visit (INDEPENDENT_AMBULATORY_CARE_PROVIDER_SITE_OTHER): Payer: BC Managed Care – PPO | Admitting: Nurse Practitioner

## 2019-03-19 ENCOUNTER — Other Ambulatory Visit: Payer: Self-pay

## 2019-03-19 ENCOUNTER — Encounter: Payer: Self-pay | Admitting: Nurse Practitioner

## 2019-03-19 ENCOUNTER — Other Ambulatory Visit (INDEPENDENT_AMBULATORY_CARE_PROVIDER_SITE_OTHER): Payer: BC Managed Care – PPO

## 2019-03-19 ENCOUNTER — Ambulatory Visit (HOSPITAL_COMMUNITY)
Admission: RE | Admit: 2019-03-19 | Discharge: 2019-03-19 | Disposition: A | Discharge status: Home / Self Care | Payer: BC Managed Care – PPO | Source: Ambulatory Visit | Attending: Nurse Practitioner | Admitting: Nurse Practitioner

## 2019-03-19 VITALS — BP 140/80 | HR 64 | Temp 98.6°F | Ht 75.0 in | Wt 229.0 lb

## 2019-03-19 DIAGNOSIS — K529 Noninfective gastroenteritis and colitis, unspecified: Secondary | ICD-10-CM

## 2019-03-19 DIAGNOSIS — R1032 Left lower quadrant pain: Secondary | ICD-10-CM | POA: Insufficient documentation

## 2019-03-19 DIAGNOSIS — R1012 Left upper quadrant pain: Secondary | ICD-10-CM

## 2019-03-19 LAB — CBC WITH DIFFERENTIAL/PLATELET
Basophils Absolute: 0.1 10*3/uL (ref 0.0–0.1)
Basophils Relative: 1.4 % (ref 0.0–3.0)
Eosinophils Absolute: 0.2 10*3/uL (ref 0.0–0.7)
Eosinophils Relative: 2.1 % (ref 0.0–5.0)
HCT: 45.2 % (ref 39.0–52.0)
Hemoglobin: 15.6 g/dL (ref 13.0–17.0)
Lymphocytes Relative: 31 % (ref 12.0–46.0)
Lymphs Abs: 2.7 10*3/uL (ref 0.7–4.0)
MCHC: 34.5 g/dL (ref 30.0–36.0)
MCV: 91 fl (ref 78.0–100.0)
Monocytes Absolute: 0.6 10*3/uL (ref 0.1–1.0)
Monocytes Relative: 7.3 % (ref 3.0–12.0)
Neutro Abs: 5 10*3/uL (ref 1.4–7.7)
Neutrophils Relative %: 58.2 % (ref 43.0–77.0)
Platelets: 286 10*3/uL (ref 150.0–400.0)
RBC: 4.97 Mil/uL (ref 4.22–5.81)
RDW: 13.3 % (ref 11.5–15.5)
WBC: 8.7 10*3/uL (ref 4.0–10.5)

## 2019-03-19 LAB — COMPREHENSIVE METABOLIC PANEL
ALT: 29 U/L (ref 0–53)
AST: 23 U/L (ref 0–37)
Albumin: 4.7 g/dL (ref 3.5–5.2)
Alkaline Phosphatase: 80 U/L (ref 39–117)
BUN: 22 mg/dL (ref 6–23)
CO2: 24 mEq/L (ref 19–32)
Calcium: 9.9 mg/dL (ref 8.4–10.5)
Chloride: 105 mEq/L (ref 96–112)
Creatinine, Ser: 0.87 mg/dL (ref 0.40–1.50)
GFR: 95.96 mL/min (ref >60.00)
Glucose, Bld: 91 mg/dL (ref 70–99)
Potassium: 4.2 mEq/L (ref 3.5–5.1)
Sodium: 140 mEq/L (ref 135–145)
Total Bilirubin: 0.6 mg/dL (ref 0.2–1.2)
Total Protein: 7.7 g/dL (ref 6.0–8.3)

## 2019-03-19 LAB — C-REACTIVE PROTEIN: CRP: <1 mg/dL (ref 0.5–20.0)

## 2019-03-19 MED ORDER — SODIUM CHLORIDE (PF) 0.9 % IJ SOLN
INTRAMUSCULAR | Status: AC
  Filled 2019-03-19: qty 50

## 2019-03-19 MED ORDER — IOHEXOL 9 MG/ML PO SOLN
ORAL | Status: AC
  Administered 2019-03-19: 10:00:00 1000 mL via ORAL
  Filled 2019-03-19: qty 1000

## 2019-03-19 MED ORDER — IOHEXOL 9 MG/ML PO SOLN: 500.0000 mL | ORAL | Status: AC

## 2019-03-19 MED ORDER — IOHEXOL 300 MG/ML  SOLN
100.0000 mL | Freq: Once | INTRAMUSCULAR | Status: AC
  Administered 2019-03-19: 13:00:00 100 mL via INTRAVENOUS

## 2019-03-19 MED ORDER — DICYCLOMINE HCL 10 MG PO CAPS: ORAL_CAPSULE | ORAL | 0 refills | Status: AC

## 2019-03-19 NOTE — Patient Instructions (Addendum)
If you are age 43 or older, your body mass index should be between 23-30. Your Body mass index is 28.62 kg/m. If this is out of the aforementioned range listed, please consider follow up with your Primary Care Provider.  If you are age 42 or younger, your body mass index should be between 19-25. Your Body mass index is 28.62 kg/m. If this is out of the aformentioned range listed, please consider follow up with your Primary Care Provider.   Your provider has requested that you go to the basement level for lab work before leaving today. Press "B" on the elevator. The lab is located at the first door on the left as you exit the elevator.  Go to [facility] (1st floor radiology) now for CT of abdomen and pelvis.   We have sent the following medications to your pharmacy for you to pick up at your convenience:  START: dicyclomine 10mg  take on e tablet every 8 hours as needed for abdominal pain.  START: probiotic as given by your PCP  Due to recent changes in healthcare laws, you may see the results of your imaging and laboratory studies on MyChart before your provider has had a chance to review them.  We understand that in some cases there may be results that are confusing or concerning to you. Not all laboratory results come back in the same time frame and the provider may be waiting for multiple results in order to interpret others.  Please give [place] 48 hours in order for your provider to thoroughly review all the results before contacting the office for clarification of your results.    Please report to ER if you develop severe abdominal pain.  Further follow up will be determined after labs and CT results are received.

## 2019-03-20 NOTE — Progress Notes (Signed)
Attending Physician's Attestation   I have reviewed the chart.   I agree with the Advanced Practitioner's note, impression, and recommendations with any updates as below.  Agree with cross-sectional imaging to ensure no significant/severe etiology for pain.  If unremarkable may need to think about a TCA at some point for functional abdominal pain.  We will see how he does and subsequently have follow-up determined based on findings of CT scan.  Justice Britain, MD [facility] # [phone]

## 2019-06-08 ENCOUNTER — Other Ambulatory Visit: Payer: Self-pay | Admitting: Gastroenterology

## 2019-10-04 ENCOUNTER — Other Ambulatory Visit: Payer: Self-pay | Admitting: Gastroenterology

## 2020-02-16 ENCOUNTER — Other Ambulatory Visit: Payer: Self-pay

## 2020-02-16 MED ORDER — OMEPRAZOLE 40 MG PO CPDR: DELAYED_RELEASE_CAPSULE | ORAL | 2 refills | Status: AC

## 2020-06-06 IMAGING — CT CT ABDOMEN AND PELVIS WITH CONTRAST
2 of 5 series · 16 of 46 positions shown, 18 images · IV contrast (OMNIPAQUE 300)
Comparison: None.

CLINICAL DATA: Patient with history of esophagitis. Dysphagia.
Chronic diarrhea.

EXAM:
CT ABDOMEN AND PELVIS WITH CONTRAST
TECHNIQUE: Multidetector CT imaging of the abdomen and pelvis was performed
using the standard protocol following bolus administration of
intravenous contrast.
CONTRAST:  100mL OMNIPAQUE IOHEXOL 300 MG/ML  SOLN

[Series 2: abd/pel w · axial · 0.82mm/px · z∈[-524,-49]mm · 13 of 107 slices shown, 15 images]
[im 6/107  soft-tissue]
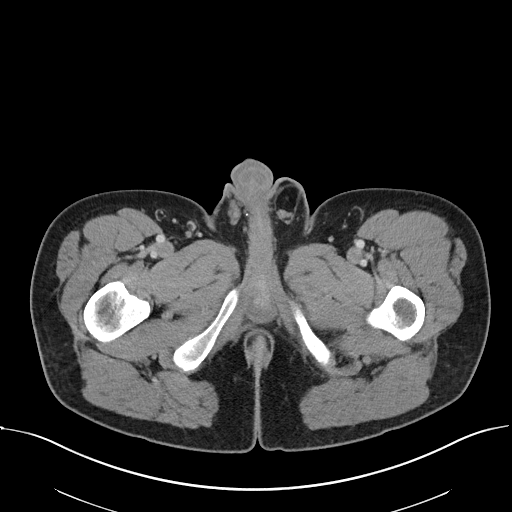
[im 6/107  bone]
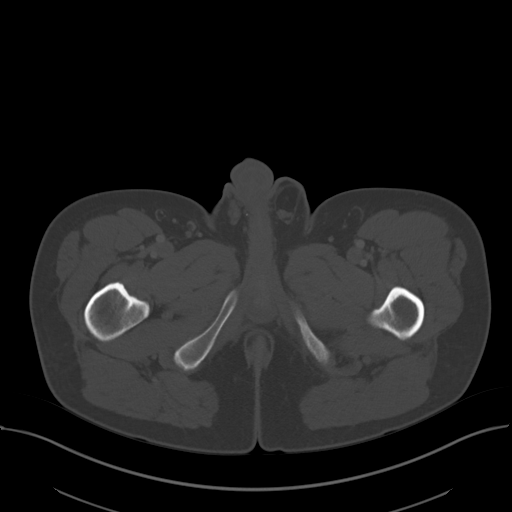
[im 12/107  soft-tissue]
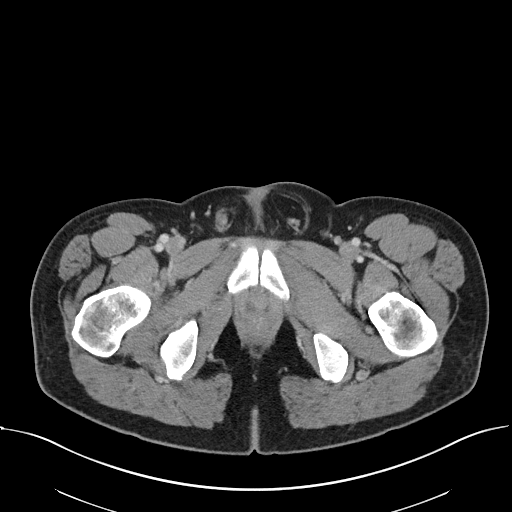
[im 24/107  soft-tissue]
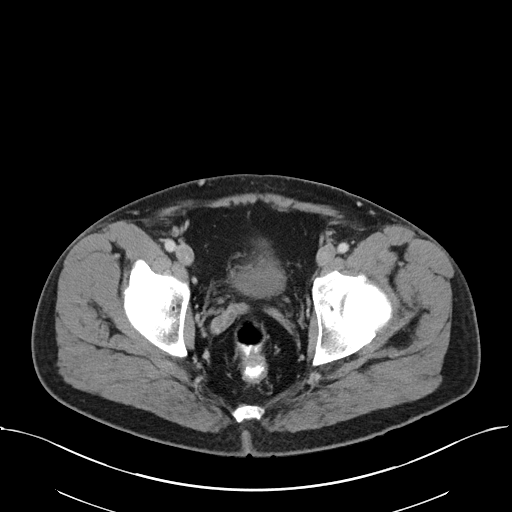
[im 30/107  soft-tissue]
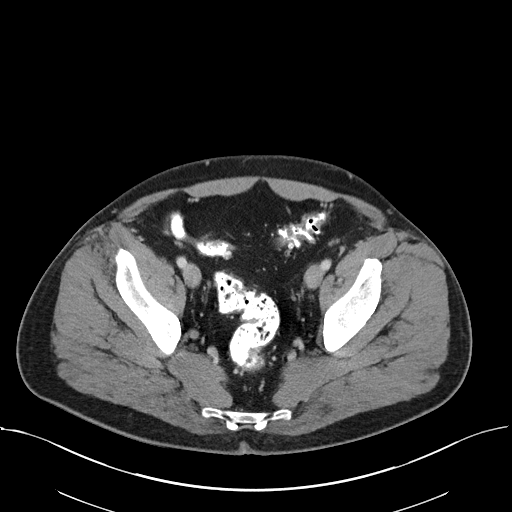
[im 36/107  soft-tissue]
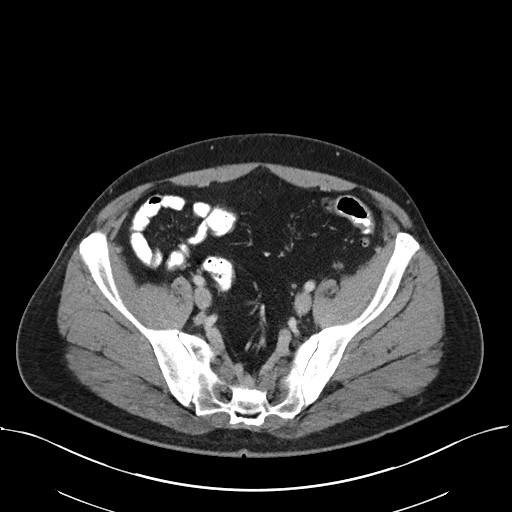
[im 48/107  soft-tissue]
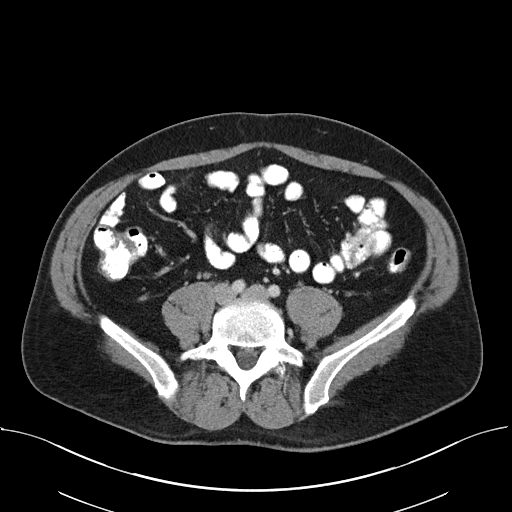
[im 54/107  soft-tissue]
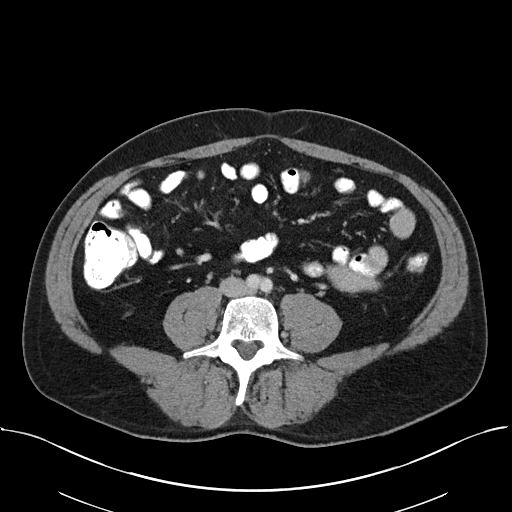
[im 59/107  soft-tissue]
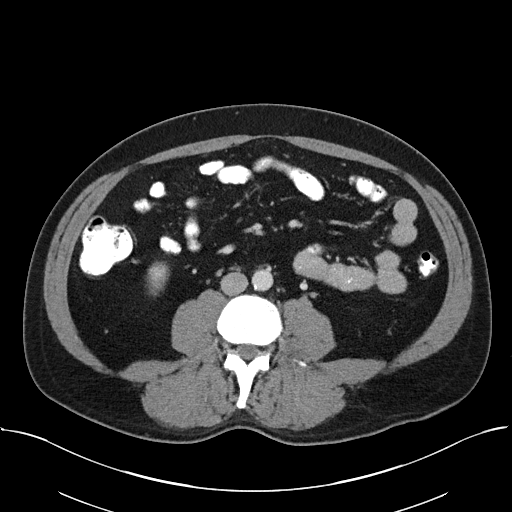
[im 71/107  soft-tissue]
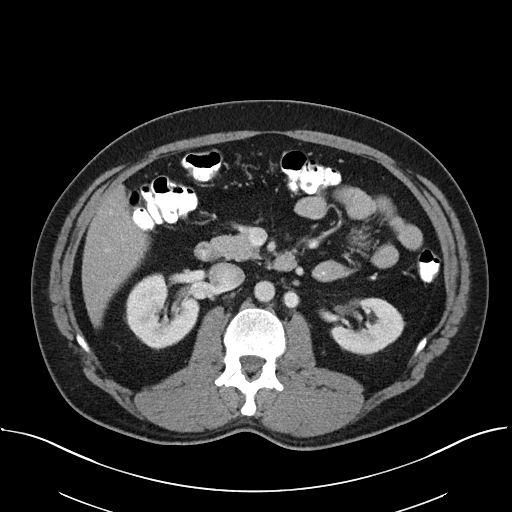
[im 71/107  bone]
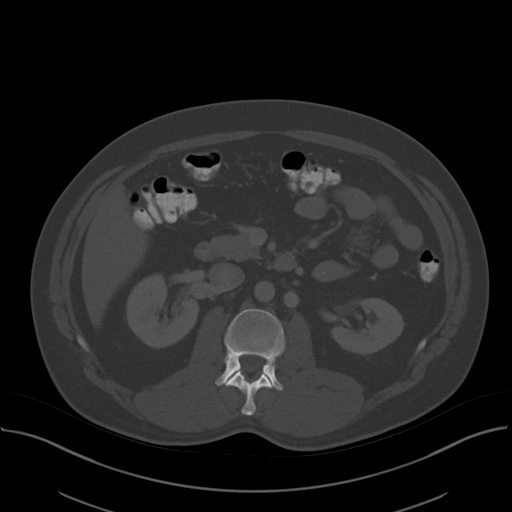
[im 77/107  soft-tissue]
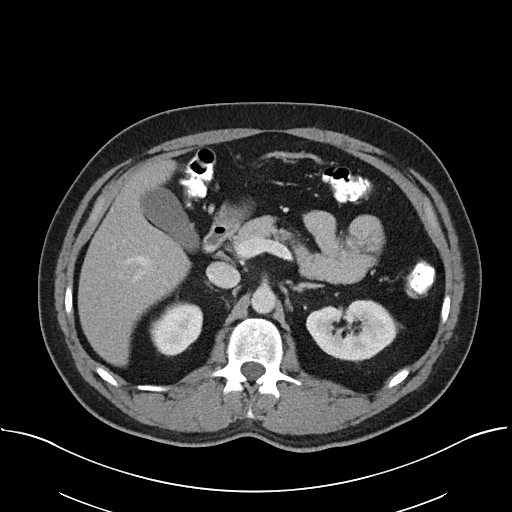
[im 83/107  soft-tissue]
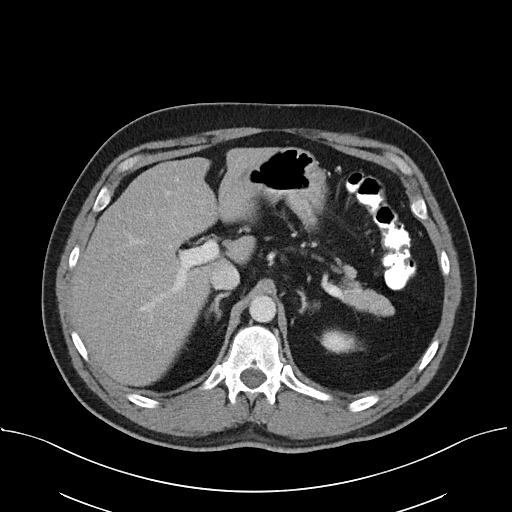
[im 95/107  soft-tissue]
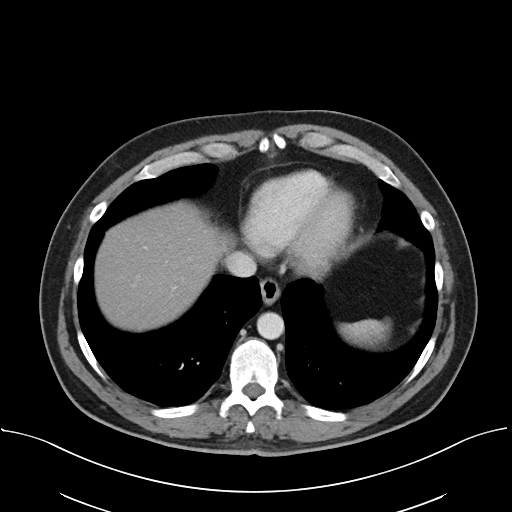
[im 101/107  soft-tissue]
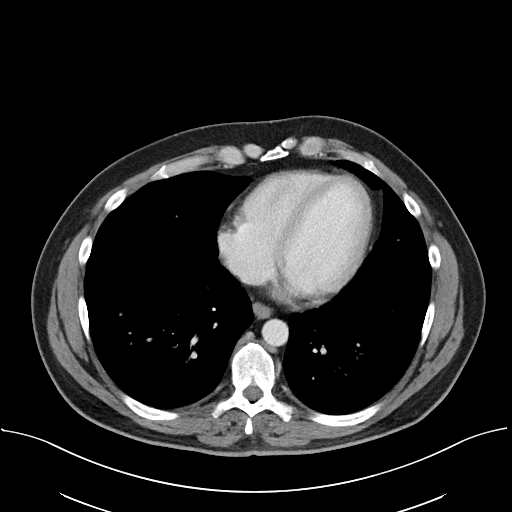

[Series 5: abd/pel w st · coronal · 0.80mm/px · 3 of 107 slices shown]
[im 36/107  soft-tissue]
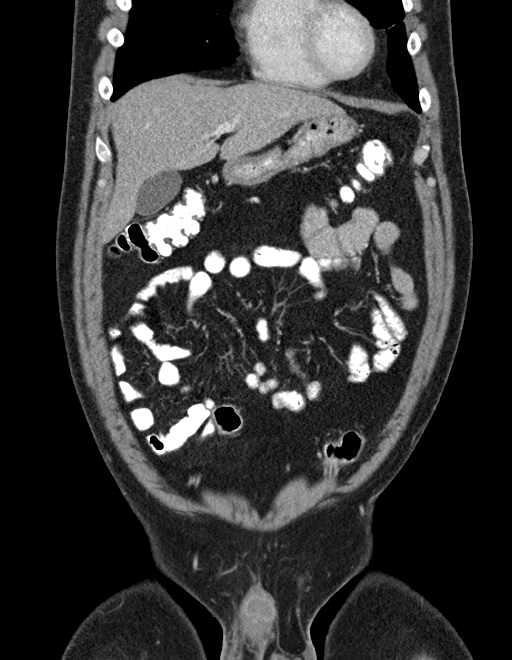
[im 48/107  soft-tissue]
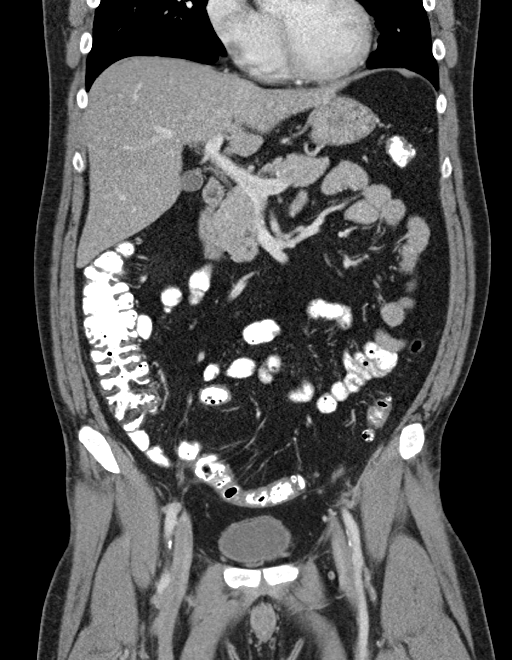
[im 59/107  soft-tissue]
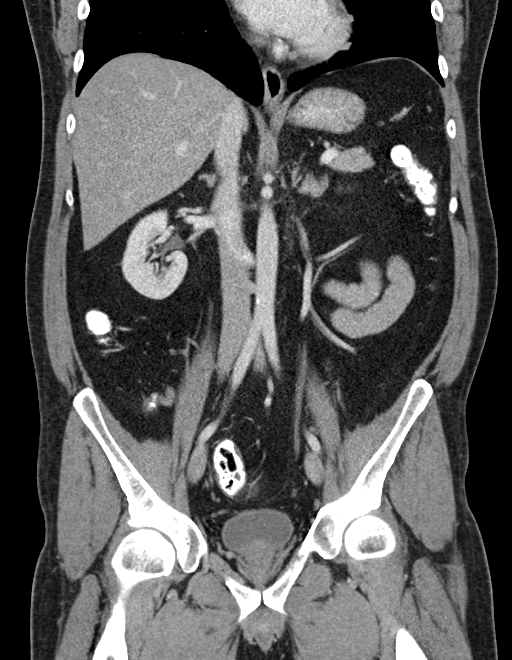

[16 of 46 positions shown; findings below may reference images not displayed]

FINDINGS: Lower chest: Normal heart size. Lung bases are clear. No pleural
effusion.

Hepatobiliary: The liver is normal in size and contour. No focal
hepatic lesion is identified. Gallbladder is unremarkable. No
intrahepatic or extrahepatic biliary ductal dilatation.

Pancreas: Unremarkable

Spleen: Unremarkable

Adrenals/Urinary Tract: Adrenal glands are normal. Kidneys enhance
symmetrically with contrast. No hydronephrosis. Urinary bladder is
unremarkable.

Stomach/Bowel: Normal morphology of the stomach. No evidence for
small bowel obstruction. No free fluid or free intraperitoneal air.
Normal appendix. There is mild wall thickening descending and
sigmoid colon without surrounding fat stranding suggestive of
sequelae of prior episodes of colitis. Sigmoid colonic
diverticulosis without CT evidence for acute diverticulitis.

Vascular/Lymphatic: Normal caliber abdominal aorta. Retroaortic left
renal vein. No retroperitoneal lymphadenopathy.

Reproductive: Heterogeneous prostate.

Other: Moderate sized fat containing left inguinal hernia.

Musculoskeletal: Lumbar spine degenerative changes. No aggressive or
acute appearing osseous lesions.
IMPRESSION: No acute process within the abdomen or pelvis.
